# Patient Record
Sex: Female | Born: 1945 | Race: White | Hispanic: No | Marital: Married | Smoking: Never smoker
Health system: Southern US, Community
[De-identification: ages and names within clinical notes are randomized; demographics above are authoritative.]

## PROBLEM LIST (undated history)

## (undated) DIAGNOSIS — E785 Hyperlipidemia, unspecified: Secondary | ICD-10-CM

## (undated) DIAGNOSIS — I10 Essential (primary) hypertension: Secondary | ICD-10-CM

## (undated) DIAGNOSIS — K219 Gastro-esophageal reflux disease without esophagitis: Secondary | ICD-10-CM

## (undated) DIAGNOSIS — B019 Varicella without complication: Secondary | ICD-10-CM

## (undated) DIAGNOSIS — M8580 Other specified disorders of bone density and structure, unspecified site: Secondary | ICD-10-CM

## (undated) DIAGNOSIS — I499 Cardiac arrhythmia, unspecified: Secondary | ICD-10-CM

## (undated) HISTORY — PX: CHOLESTEATOMA EXCISION: SHX1345

---

## 2005-08-22 ENCOUNTER — Ambulatory Visit: Payer: Self-pay | Admitting: Family Medicine

## 2006-08-31 ENCOUNTER — Ambulatory Visit: Payer: Self-pay | Admitting: Unknown Physician Specialty

## 2006-09-25 ENCOUNTER — Ambulatory Visit: Payer: Self-pay | Admitting: Family Medicine

## 2006-10-06 ENCOUNTER — Ambulatory Visit: Payer: Self-pay | Admitting: Family Medicine

## 2006-12-30 ENCOUNTER — Ambulatory Visit: Payer: Self-pay | Admitting: Gastroenterology

## 2007-11-18 ENCOUNTER — Ambulatory Visit: Payer: Self-pay | Admitting: Family Medicine

## 2008-12-18 ENCOUNTER — Ambulatory Visit: Payer: Self-pay | Admitting: Family Medicine

## 2009-01-02 ENCOUNTER — Ambulatory Visit: Payer: Self-pay | Admitting: Family Medicine

## 2009-07-05 ENCOUNTER — Ambulatory Visit: Payer: Self-pay | Admitting: Family Medicine

## 2010-05-30 ENCOUNTER — Ambulatory Visit: Payer: Self-pay | Admitting: Family Medicine

## 2011-09-09 ENCOUNTER — Ambulatory Visit: Payer: Self-pay | Admitting: Family Medicine

## 2012-09-09 ENCOUNTER — Ambulatory Visit: Payer: Self-pay | Admitting: Family Medicine

## 2012-09-30 ENCOUNTER — Inpatient Hospital Stay: Payer: Self-pay | Admitting: Family Medicine

## 2012-09-30 LAB — CBC
HCT: 36.6 % (ref 35.0–47.0)
HGB: 12.3 g/dL (ref 12.0–16.0)
MCH: 31.5 pg (ref 26.0–34.0)
Platelet: 248 10*3/uL (ref 150–440)
RBC: 3.91 10*6/uL (ref 3.80–5.20)
WBC: 11.2 10*3/uL — ABNORMAL HIGH (ref 3.6–11.0)

## 2012-09-30 LAB — COMPREHENSIVE METABOLIC PANEL
Albumin: 4.3 g/dL (ref 3.4–5.0)
Anion Gap: 6 — ABNORMAL LOW (ref 7–16)
BUN: 13 mg/dL (ref 7–18)
Bilirubin,Total: 0.4 mg/dL (ref 0.2–1.0)
EGFR (African American): >60
EGFR (Non-African Amer.): 58 — ABNORMAL LOW
Glucose: 132 mg/dL — ABNORMAL HIGH (ref 65–99)
Potassium: 4.1 mmol/L (ref 3.5–5.1)
SGOT(AST): 27 U/L (ref 15–37)
Sodium: 141 mmol/L (ref 136–145)
Total Protein: 7.7 g/dL (ref 6.4–8.2)

## 2012-09-30 LAB — CK TOTAL AND CKMB (NOT AT ARMC)
CK, Total: 95 U/L (ref 21–215)
CK-MB: <0.5 ng/mL — ABNORMAL LOW (ref 0.5–3.6)

## 2012-10-01 LAB — TROPONIN I
Troponin-I: <0.02 ng/mL
Troponin-I: <0.02 ng/mL

## 2012-10-01 LAB — LIPID PANEL
Cholesterol: 138 mg/dL (ref 0–200)
Ldl Cholesterol, Calc: 72 mg/dL (ref 0–100)
Triglycerides: 96 mg/dL (ref 0–200)
VLDL Cholesterol, Calc: 19 mg/dL (ref 5–40)

## 2012-10-01 LAB — MAGNESIUM: Magnesium: 2 mg/dL

## 2012-10-01 LAB — CREATININE, SERUM
Creatinine: 0.93 mg/dL (ref 0.60–1.30)
EGFR (Non-African Amer.): >60

## 2012-10-01 LAB — POTASSIUM: Potassium: 4 mmol/L (ref 3.5–5.1)

## 2012-10-01 LAB — PROTIME-INR
INR: 0.9
Prothrombin Time: 12.8 secs (ref 11.5–14.7)

## 2012-10-02 LAB — COMPREHENSIVE METABOLIC PANEL
Alkaline Phosphatase: 71 U/L (ref 50–136)
Bilirubin,Total: 0.4 mg/dL (ref 0.2–1.0)
Calcium, Total: 8.8 mg/dL (ref 8.5–10.1)
Chloride: 109 mmol/L — ABNORMAL HIGH (ref 98–107)
Co2: 27 mmol/L (ref 21–32)
Creatinine: 1.07 mg/dL (ref 0.60–1.30)
EGFR (Non-African Amer.): 54 — ABNORMAL LOW
Potassium: 4.3 mmol/L (ref 3.5–5.1)
SGOT(AST): 18 U/L (ref 15–37)
SGPT (ALT): 22 U/L (ref 12–78)
Sodium: 138 mmol/L (ref 136–145)

## 2012-10-02 LAB — PROTIME-INR: Prothrombin Time: 13.7 secs (ref 11.5–14.7)

## 2012-10-02 LAB — CREATININE, SERUM: Creatinine: 1.01 mg/dL

## 2012-10-02 LAB — POTASSIUM: Potassium: 4.1 mmol/L

## 2012-10-03 LAB — BASIC METABOLIC PANEL
Anion Gap: 8 (ref 7–16)
BUN: 15 mg/dL (ref 7–18)
Calcium, Total: 8.8 mg/dL (ref 8.5–10.1)
Creatinine: 1.04 mg/dL (ref 0.60–1.30)
EGFR (Non-African Amer.): 56 — ABNORMAL LOW
Osmolality: 282 (ref 275–301)
Potassium: 4 mmol/L (ref 3.5–5.1)

## 2012-10-03 LAB — PROTIME-INR
INR: 1.2
Prothrombin Time: 16 secs — ABNORMAL HIGH (ref 11.5–14.7)

## 2013-09-12 ENCOUNTER — Ambulatory Visit: Payer: Self-pay | Admitting: Family Medicine

## 2013-09-27 ENCOUNTER — Ambulatory Visit: Payer: Self-pay | Admitting: Gastroenterology

## 2013-09-27 HISTORY — PX: COLONOSCOPY: SHX174

## 2013-09-28 LAB — PATHOLOGY REPORT

## 2014-09-25 ENCOUNTER — Ambulatory Visit: Payer: Self-pay | Admitting: Family Medicine

## 2015-02-13 NOTE — H&P (Signed)
PATIENT NAME:  PANDA, CROSSIN MR#:  [id number] DATE OF BIRTH:  [birth year]  DATE OF ADMISSION:  09/30/2012  PRIMARY CARE PHYSICIAN: Dr. Kary Kos   REFERRING PHYSICIAN: Dr. Darl Householder   CHIEF COMPLAINT: Chest pain today.   HISTORY OF PRESENT ILLNESS: The patient is a 69 year old Caucasian female with a history of hyperlipidemia who presented to the ED with chest pain today about 4 p.m. The chest pain is actually tight, constant, moderate, no radiation. The patient also has mild shortness of breath but denies any fever, chills, headache, dizziness, orthopnea, nocturnal dyspnea, or leg edema. The patient has no nausea, vomiting, or diarrhea. She was noted to have Afib with heart rate of 150, treated with diltiazem and heart rate decreased to about 99. The patient's troponin level is normal, less than 0.02.   PAST MEDICAL HISTORY: Hyperlipidemia.   SOCIAL HISTORY: No smoking, alcohol drinking, or illicit drugs. Living with her husband.   FAMILY HISTORY: Mother had hypertension.   PAST SURGICAL HISTORY: Ear surgery.  ALLERGIES: Allergic to codeine.   HOME MEDICATIONS: Atorvastatin 10 mg p.o. daily.   REVIEW OF SYSTEMS: CONSTITUTIONAL: The patient denies any fever or chills. No headache, no dizziness, no weakness, weight loss, weight gain. EYES: No double vision or blurred vision. ENT: No postnasal drip, slurred speech, or dysphagia. No epistaxis. CARDIOVASCULAR: Chest tightness. No palpitations, orthopnea, or nocturnal dyspnea. No leg edema. PULMONARY: No cough, sputum, shortness of breath, or hemoptysis. GI: No abdominal pain, nausea, vomiting, or diarrhea. No melena or bloody stool. GU: No dysuria, hematuria, or incontinence. ENDOCRINE: No polyuria, polydipsia, heat or cold intolerance. HEMATOLOGY: No easy bruising or bleeding. NEUROLOGY: No syncope, loss of consciousness, or seizure. PSYCH: No depression or anxiety. SKIN: No rash or jaundice.   PHYSICAL EXAMINATION:   VITAL SIGNS: Temperature 98.1,  blood pressure 170/96, pulse 140, respirations 20, oxygen saturation 95% on room air.   GENERAL: The patient is alert, awake, oriented in no acute distress.   HEENT: Pupils are round, equal, reactive to light and accommodation. Moist oral mucosa. Clear oropharynx.   NECK: Supple. No JVD or carotid bruits. No lymphadenopathy. No thyromegaly.   CARDIOVASCULAR: S1, S2 regular rate and rhythm. No murmurs or gallops.   PULMONARY: Bilateral air entry. No wheezing or rales. No use of accessory muscles to breathe.   ABDOMEN: Soft. No distention. No tenderness. No organomegaly. Bowel sounds present.   EXTREMITIES: No edema, clubbing, or cyanosis. No calf tenderness. Strong bilateral pedal pulses.   NEUROLOGY: Alert and oriented x3. No focal deficits. Power 5 out of 5. Sensation intact.   LABORATORY, DIAGNOSTIC, AND RADIOLOGICAL DATA: Chest x-ray no evidence for pneumonia or CHF.   CK 95. CK-MB less than 0.5. Glucose 132, BUN 13, creatinine 1.02. Electrolytes are normal. WBC 11.2, hemoglobin 12.3, platelets 248. Troponin less than 0.02.   EKG showed atrial fibrillation at 141 beats per minute with right bundle branch block. Second EKG showed atrial fibrillation at 99 beats per minute with right bundle branch block.   IMPRESSION:  1. Chest pain, rule out ACS.  2. New onset atrial fibrillation.  3. Right bundle branch block. 4. Hyperlipidemia.   PLAN OF TREATMENT:  1. The patient will be placed for observation.  2. Will continue telemonitor. 3. Will give aspirin 81 mg p.o. daily.  4. Give Lovenox 1 mg/kg q.12 hours and start Lopressor 25 mg p.o. q.12 hours. 5. In addition, we will get an echocardiogram and Cardiology consult. The patient has new onset atrial fibrillation but  Mali score is low risk. Possibly she does not need anticoagulation but we will follow-up with cardiologist.  6. For chest pain we will check troponin level, lipid panel, and TSH. In addition, we will get a stress test.   7. GI and DVT prophylaxis.   Discussed the patient's situation with the patient and her husband.   TIME SPENT: About 55 minutes.   ____________________________ Demetrios Loll, MD qc:drc D: 09/30/2012 07:37:10 ET T: 10/01/2012 06:39:34 ET JOB#: [id number]  cc: Demetrios Loll, MD, <Dictator> Irven Easterly. Kary Kos, MD Demetrios Loll MD ELECTRONICALLY SIGNED 10/02/2012 12:57

## 2015-02-13 NOTE — Discharge Summary (Signed)
PATIENT NAME:  Betty Nichols, Betty Nichols MR#:  [id number] DATE OF BIRTH:  1946/04/10  DATE OF ADMISSION:  09/30/2012 DATE OF DISCHARGE:  10/04/2012  ADMISSION DIAGNOSIS: Chest pain.   DISCHARGE DIAGNOSES:  1. Atrial fibrillation with rapid ventricular response.  2. Chest pain, likely due to rapid ventricular response.  3. Hyperlipidemia.  4. Gastroesophageal reflux disease.  5. Hyperglycemia with normal hemoglobin A1c, likely due to stress.   DISPOSITION: Home.   FOLLOW UP: Follow up Dr. Neoma Laming tomorrow for EKG and INR check.   MEDICATIONS AT DISCHARGE; 1. Continue iron therapy.  2. Continue calcium therapy.  3. Continue Tikosyn 500 mcg or capsule twice daily.  4. Coumadin 7.5 mg once a day. 5. Atorvastatin 10 mg once a day.   LABORATORY, DIAGNOSTIC AND RADIOLOGICAL DATA: Glucose ranging in between 100 to 148, creatinine 1.04, sodium 141, potassium 4. LFTs within normal limits. Cardiac enzymes negative x3. TSH 0.7, hemoglobin 12.3, white count 11.2, platelets 248. INR 1.2 at discharge. EKG atrial fibrillation with rapid ventricular response, right now in normal sinus rhythm. CT of the chest with IV contrast to rule out pulmonary embolism was negative. No other findings. Chest x-ray without evidence of pneumonia or congestive heart failure. Echo Doppler normal chamber size, normal left ventricular ejection fraction, mild MR, trace TR, PR, normal pulmonary pressures, ejection fraction 55%.   HOSPITAL COURSE: Ms. Betty Nichols is a very nice 69 year old female who has history of hyperlipidemia mostly who came to the ER on 09/30/2012 around 4:00 p.m. with chest pain that started during the hours of the afternoon. The pain was constant, felt like it tightness on her chest, moderate intensity with no radiation. At that moment she was starting to feel a little bit short of breath but she did have any fevers, chills, headaches, or any other problems. The patient did not have any nausea, vomiting, or  diaphoresis. She was found to be in atrial fibrillation with heart rate of 150s for what diltiazem was given. Patient was fully anticoagulated with Lovenox and Lopressor was started as well. The patient had a new onset atrial fibrillation and his CHADS score is 1. Dr. Neoma Laming consulted for the patient. He decided to start Tikosyn and the patient converted to normal sinus rhythm on 10/03/2012. Her heart rate remained elevated in the 130s to 160s prior to the 8th for what her medications were adjusted. At this moment she is asymptomatic and her heart rate is in the 60s to 80s. Metoprolol has been stopped due to the patient's low blood pressure. She does not a current diagnosis of hypertension. She actually does not have any history of coronary artery disease although she will follow up with Dr. Humphrey Rolls for future stress tests once her heart rate is stable for a while. Dr. Humphrey Rolls wants to keep her on Coumadin for at least a month despite the fact that she had a low CHADS score only as a precaution. She is going to be following her INR with him tomorrow and also EKG due to the possible prolonged QT syndrome that could happen with Tikosyn. Patient is discharged in good condition.   TIME SPENT: I spent about 40 minutes with this discharge.  ____________________________ [place] Sink, MD rsg:cms D: 10/04/2012 10:00:02 ET T: 10/04/2012 10:12:06 ET JOB#: [id number]  cc: [place] Sink, MD, <Dictator> Jonesha Tsuchiya America Brown MD ELECTRONICALLY SIGNED 10/10/2012 14:07

## 2015-02-16 NOTE — Consult Note (Signed)
PATIENT NAME:  Betty Nichols, Betty Nichols MR#:  [id number] DATE OF BIRTH:  09/24/1946  DATE OF CONSULTATION:  10/01/2012  REFERRING PHYSICIAN:   CONSULTING PHYSICIAN:  Dionisio David, MD  INDICATION FOR CONSULTATION: Atrial fibrillation.   HISTORY OF PRESENT ILLNESS: This is a 69 year old pleasant white female with a past medical history of hypertension and hyperlipidemia who came into the hospital because of chest pain. The chest pain was burning-type, associated with some shortness of breath. No palpitation. She was found to be in atrial fibrillation, rate 160 when she exerts.   PAST MEDICAL HISTORY: History of hypercholesterolemia. She takes atorvastatin 10 mg once a day.   SOCIAL HISTORY: Unremarkable.   FAMILY HISTORY: Unremarkable.   ALLERGIES: None.   PHYSICAL EXAMINATION:  GENERAL: She is alert, oriented times three, in no acute distress. Pulse right now is 104. This morning it went up as high as 160 on the monitor. Blood pressure 118/73, respirations 18, temperature 98.1, and saturation 96.   NECK: No JVD.   LUNGS: Clear.   HEART: Irregularly irregular pulse. Normal S1, S2. No audible murmur.   ABDOMEN: Soft, nontender, positive bowel sounds.   EXTREMITIES: No pedal edema.   NEUROLOGIC: She appears to be intact.   LABS/STUDIES: EKG shows atrial fibrillation at 138 beats per minute, nonspecific ST-T changes, right bundle branch block.   ASSESSMENT AND PLAN: The patient has atrial fibrillation with rapid ventricular response rate. She is already on metoprolol.   PLAN: Start the patient on Tikosyn and we will try to convert the patient to sinus rhythm. Also we will get an echocardiogram and cancel the stress test. Thank you very much for the referral.    ____________________________ Dionisio David, MD sak:bjt D: 10/01/2012 08:54:29 ET T: 10/01/2012 09:32:35 ET JOB#: [id number]  cc: Dionisio David, MD, <Dictator> Dionisio David MD ELECTRONICALLY SIGNED 11/01/2012 8:01

## 2015-04-04 ENCOUNTER — Other Ambulatory Visit: Payer: Self-pay | Admitting: Internal Medicine

## 2015-04-04 DIAGNOSIS — R05 Cough: Secondary | ICD-10-CM

## 2015-04-04 DIAGNOSIS — R059 Cough, unspecified: Secondary | ICD-10-CM

## 2015-04-04 DIAGNOSIS — K219 Gastro-esophageal reflux disease without esophagitis: Secondary | ICD-10-CM

## 2015-04-11 ENCOUNTER — Ambulatory Visit
Admission: RE | Admit: 2015-04-11 | Discharge: 2015-04-11 | Disposition: A | Discharge status: Home / Self Care | Payer: Medicare Other | Source: Ambulatory Visit | Attending: Internal Medicine | Admitting: Internal Medicine

## 2015-04-11 DIAGNOSIS — R059 Cough, unspecified: Secondary | ICD-10-CM

## 2015-04-11 DIAGNOSIS — K219 Gastro-esophageal reflux disease without esophagitis: Secondary | ICD-10-CM | POA: Diagnosis present

## 2015-04-11 DIAGNOSIS — R05 Cough: Secondary | ICD-10-CM

## 2015-04-11 DIAGNOSIS — K449 Diaphragmatic hernia without obstruction or gangrene: Secondary | ICD-10-CM | POA: Insufficient documentation

## 2015-08-28 ENCOUNTER — Other Ambulatory Visit: Payer: Self-pay | Admitting: Family Medicine

## 2015-08-28 DIAGNOSIS — Z1231 Encounter for screening mammogram for malignant neoplasm of breast: Secondary | ICD-10-CM

## 2015-09-27 ENCOUNTER — Ambulatory Visit
Admission: RE | Admit: 2015-09-27 | Discharge: 2015-09-27 | Disposition: A | Discharge status: Home / Self Care | Payer: Medicare Other | Source: Ambulatory Visit | Attending: Family Medicine | Admitting: Family Medicine

## 2015-09-27 DIAGNOSIS — Z1231 Encounter for screening mammogram for malignant neoplasm of breast: Secondary | ICD-10-CM | POA: Diagnosis present

## 2016-09-08 ENCOUNTER — Other Ambulatory Visit: Payer: Self-pay | Admitting: Family Medicine

## 2016-09-08 DIAGNOSIS — Z1231 Encounter for screening mammogram for malignant neoplasm of breast: Secondary | ICD-10-CM

## 2016-10-14 ENCOUNTER — Ambulatory Visit
Admission: RE | Admit: 2016-10-14 | Discharge: 2016-10-14 | Disposition: A | Discharge status: Home / Self Care | Payer: Medicare Other | Source: Ambulatory Visit | Attending: Family Medicine | Admitting: Family Medicine

## 2016-10-14 DIAGNOSIS — Z1231 Encounter for screening mammogram for malignant neoplasm of breast: Secondary | ICD-10-CM | POA: Diagnosis present

## 2017-09-04 ENCOUNTER — Other Ambulatory Visit: Payer: Self-pay | Admitting: Family Medicine

## 2017-09-04 DIAGNOSIS — Z1231 Encounter for screening mammogram for malignant neoplasm of breast: Secondary | ICD-10-CM

## 2017-10-16 ENCOUNTER — Ambulatory Visit
Admission: RE | Admit: 2017-10-16 | Discharge: 2017-10-16 | Disposition: A | Discharge status: Home / Self Care | Payer: Medicare Other | Source: Ambulatory Visit | Attending: Family Medicine | Admitting: Family Medicine

## 2017-10-16 DIAGNOSIS — Z1231 Encounter for screening mammogram for malignant neoplasm of breast: Secondary | ICD-10-CM | POA: Insufficient documentation

## 2018-09-08 ENCOUNTER — Other Ambulatory Visit: Payer: Self-pay | Admitting: Family Medicine

## 2018-09-08 DIAGNOSIS — Z1231 Encounter for screening mammogram for malignant neoplasm of breast: Secondary | ICD-10-CM

## 2018-10-18 ENCOUNTER — Ambulatory Visit
Admission: RE | Admit: 2018-10-18 | Discharge: 2018-10-18 | Disposition: A | Discharge status: Home / Self Care | Payer: Medicare Other | Source: Ambulatory Visit | Attending: Family Medicine | Admitting: Family Medicine

## 2018-10-18 DIAGNOSIS — Z1231 Encounter for screening mammogram for malignant neoplasm of breast: Secondary | ICD-10-CM | POA: Diagnosis present

## 2018-11-15 ENCOUNTER — Encounter: Payer: Self-pay | Admitting: *Deleted

## 2018-11-16 ENCOUNTER — Encounter: Payer: Self-pay | Admitting: Student

## 2018-11-16 ENCOUNTER — Ambulatory Visit: Payer: Medicare Other | Admitting: Certified Registered"

## 2018-11-16 ENCOUNTER — Encounter
Admission: RE | Disposition: A | Discharge status: Home / Self Care | Payer: Self-pay | Source: Home / Self Care | Attending: Gastroenterology

## 2018-11-16 ENCOUNTER — Ambulatory Visit
Admission: RE | Admit: 2018-11-16 | Discharge: 2018-11-16 | Disposition: A | Discharge status: Home / Self Care | Payer: Medicare Other | Attending: Gastroenterology | Admitting: Gastroenterology

## 2018-11-16 ENCOUNTER — Other Ambulatory Visit: Payer: Self-pay

## 2018-11-16 DIAGNOSIS — K219 Gastro-esophageal reflux disease without esophagitis: Secondary | ICD-10-CM | POA: Diagnosis not present

## 2018-11-16 DIAGNOSIS — Z86010 Personal history of colonic polyps: Secondary | ICD-10-CM | POA: Insufficient documentation

## 2018-11-16 DIAGNOSIS — Z1211 Encounter for screening for malignant neoplasm of colon: Secondary | ICD-10-CM | POA: Insufficient documentation

## 2018-11-16 DIAGNOSIS — I4891 Unspecified atrial fibrillation: Secondary | ICD-10-CM | POA: Diagnosis not present

## 2018-11-16 DIAGNOSIS — I10 Essential (primary) hypertension: Secondary | ICD-10-CM | POA: Diagnosis not present

## 2018-11-16 DIAGNOSIS — K621 Rectal polyp: Secondary | ICD-10-CM | POA: Insufficient documentation

## 2018-11-16 DIAGNOSIS — Z7901 Long term (current) use of anticoagulants: Secondary | ICD-10-CM | POA: Insufficient documentation

## 2018-11-16 DIAGNOSIS — K640 First degree hemorrhoids: Secondary | ICD-10-CM | POA: Insufficient documentation

## 2018-11-16 DIAGNOSIS — E785 Hyperlipidemia, unspecified: Secondary | ICD-10-CM | POA: Insufficient documentation

## 2018-11-16 DIAGNOSIS — Z79899 Other long term (current) drug therapy: Secondary | ICD-10-CM | POA: Insufficient documentation

## 2018-11-16 DIAGNOSIS — D122 Benign neoplasm of ascending colon: Secondary | ICD-10-CM | POA: Diagnosis not present

## 2018-11-16 DIAGNOSIS — Z7951 Long term (current) use of inhaled steroids: Secondary | ICD-10-CM | POA: Insufficient documentation

## 2018-11-16 HISTORY — DX: Other specified disorders of bone density and structure, unspecified site: M85.80

## 2018-11-16 HISTORY — PX: COLONOSCOPY WITH PROPOFOL: SHX5780

## 2018-11-16 HISTORY — DX: Varicella without complication: B01.9

## 2018-11-16 HISTORY — DX: Gastro-esophageal reflux disease without esophagitis: K21.9

## 2018-11-16 HISTORY — DX: Cardiac arrhythmia, unspecified: I49.9

## 2018-11-16 HISTORY — DX: Hyperlipidemia, unspecified: E78.5

## 2018-11-16 HISTORY — DX: Essential (primary) hypertension: I10

## 2018-11-16 SURGERY — COLONOSCOPY WITH PROPOFOL
Anesthesia: General

## 2018-11-16 MED ORDER — SODIUM CHLORIDE 0.9 % IV SOLN
INTRAVENOUS | Status: DC
  Administered 2018-11-16: 08:00:00 via INTRAVENOUS

## 2018-11-16 MED ORDER — PROPOFOL 10 MG/ML IV BOLUS
INTRAVENOUS | Status: AC
  Filled 2018-11-16: qty 40

## 2018-11-16 MED ORDER — PROPOFOL 10 MG/ML IV BOLUS
INTRAVENOUS | Status: DC | PRN
  Administered 2018-11-16: 80 mg via INTRAVENOUS

## 2018-11-16 MED ORDER — PROPOFOL 500 MG/50ML IV EMUL
INTRAVENOUS | Status: DC | PRN
Start: 2018-11-16 — End: 2018-11-16
  Administered 2018-11-16: 100 ug/kg/min via INTRAVENOUS

## 2018-11-16 MED ORDER — LIDOCAINE HCL (CARDIAC) PF 100 MG/5ML IV SOSY
PREFILLED_SYRINGE | INTRAVENOUS | Status: DC | PRN
  Administered 2018-11-16: 80 mg via INTRAVENOUS

## 2018-11-16 NOTE — Anesthesia Postprocedure Evaluation (Signed)
Anesthesia Post Note  Patient: Betty Nichols  Procedure(s) Performed: COLONOSCOPY WITH PROPOFOL (N/A )  Patient location during evaluation: Endoscopy Anesthesia Type: General Level of consciousness: awake and alert Pain management: pain level controlled Vital Signs Assessment: post-procedure vital signs reviewed and stable Respiratory status: spontaneous breathing, nonlabored ventilation, respiratory function stable and patient connected to nasal cannula oxygen Cardiovascular status: blood pressure returned to baseline and stable Postop Assessment: no apparent nausea or vomiting Anesthetic complications: no     Last Vitals:  Vitals:   11/16/18 0830 11/16/18 0840  BP: 131/71 (!) 141/63  Pulse: 63 (!) 59  Resp: 17 17  Temp:    SpO2: 99% 99%    Last Pain:  Vitals:   11/16/18 0820  TempSrc: Oral  PainSc:                  Betty Nichols

## 2018-11-16 NOTE — Transfer of Care (Signed)
Immediate Anesthesia Transfer of Care Note  Patient: Betty Nichols  Procedure(s) Performed: COLONOSCOPY WITH PROPOFOL (N/A )  Patient Location: PACU  Anesthesia Type:General  Level of Consciousness: awake, alert  and oriented  Airway & Oxygen Therapy: Patient Spontanous Breathing  Post-op Assessment: Report given to RN and Post -op Vital signs reviewed and stable  Post vital signs: Reviewed and stable  Last Vitals:  Vitals Value Taken Time  BP    Temp    Pulse    Resp    SpO2      Last Pain:  Vitals:   11/16/18 0722  TempSrc: Tympanic  PainSc: 0-No pain         Complications: No apparent anesthesia complications

## 2018-11-16 NOTE — Anesthesia Post-op Follow-up Note (Signed)
Anesthesia QCDR form completed.        

## 2018-11-16 NOTE — Anesthesia Preprocedure Evaluation (Signed)
Anesthesia Evaluation  Patient identified by MRN, date of birth, ID band Patient awake    Reviewed: Allergy & Precautions, H&P , NPO status , Patient's Chart, lab work & pertinent test results  History of Anesthesia Complications Negative for: history of anesthetic complications  Airway Mallampati: III  TM Distance: <3 FB Neck ROM: limited    Dental  (+) Chipped   Pulmonary neg pulmonary ROS, neg shortness of breath,           Cardiovascular Exercise Tolerance: Good hypertension, (-) angina(-) Past MI and (-) DOE + dysrhythmias Atrial Fibrillation      Neuro/Psych negative neurological ROS  negative psych ROS   GI/Hepatic Neg liver ROS, GERD  Medicated and Controlled,  Endo/Other  negative endocrine ROS  Renal/GU negative Renal ROS  negative genitourinary   Musculoskeletal   Abdominal   Peds  Hematology negative hematology ROS (+)   Anesthesia Other Findings Past Medical History: No date: Chickenpox No date: Dysrhythmia     Comment:  atrial fib No date: GERD (gastroesophageal reflux disease) No date: Hyperlipidemia No date: Hypertension No date: Osteopenia  Past Surgical History: No date: CHOLESTEATOMA EXCISION 09/27/2013: COLONOSCOPY  BMI    Body Mass Index:  28.34 kg/m      Reproductive/Obstetrics negative OB ROS                             Anesthesia Physical Anesthesia Plan  ASA: III  Anesthesia Plan: General   Post-op Pain Management:    Induction: Intravenous  PONV Risk Score and Plan: Propofol infusion and TIVA  Airway Management Planned: Natural Airway and Nasal Cannula  Additional Equipment:   Intra-op Plan:   Post-operative Plan:   Informed Consent: I have reviewed the patients History and Physical, chart, labs and discussed the procedure including the risks, benefits and alternatives for the proposed anesthesia with the patient or authorized  representative who has indicated his/her understanding and acceptance.     Dental Advisory Given  Plan Discussed with: Anesthesiologist, CRNA and Surgeon  Anesthesia Plan Comments: (Patient consented for risks of anesthesia including but not limited to:  - adverse reactions to medications - risk of intubation if required - damage to teeth, lips or other oral mucosa - sore throat or hoarseness - Damage to heart, brain, lungs or loss of life  Patient voiced understanding.)        Anesthesia Quick Evaluation

## 2018-11-16 NOTE — H&P (Signed)
Outpatient short stay form Pre-procedure 11/16/2018 7:26 AM  Betty Sails MD  Primary Physician: Dr. [place] Pink  Reason for visit: Colonoscopy  History of present illness: Patient is a 73 year old female presenting today for colonoscopy in regards to her personal history of adenomatous colon polyps.  Her last procedure was 09/27/2013.  Patient does take Xarelto which she is held for over 48 hours.  She takes no other blood thinning agent or aspirin product.  She tolerated her prep well.   No current facility-administered medications for this encounter.   Medications Prior to Admission  Medication Sig Dispense Refill Last Dose  . atorvastatin (LIPITOR) 10 MG tablet Take 10 mg by mouth daily.   11/16/2018 at 0530  . cholecalciferol (VITAMIN D3) 10 MCG (400 UNIT) TABS tablet Take 1,000 Units by mouth daily.   Past Week at Unknown time  . dofetilide (TIKOSYN) 250 MCG capsule Take 250 mcg by mouth 2 (two) times daily.   11/16/2018 at 0530  . Ferrous Sulfate 140 (45 Fe) MG TBCR Take 140 mg by mouth daily.   Past Week at Unknown time  . fluticasone (FLONASE) 50 MCG/ACT nasal spray Place 2 sprays into both nostrils daily.     Marland Kitchen glucosamine-chondroitin 500-400 MG tablet Take 1 tablet by mouth daily.   Past Week at Unknown time  . hydrALAZINE (APRESOLINE) 50 MG tablet Take 50 mg by mouth 3 (three) times daily.   11/16/2018 at 0530  . metoprolol succinate (TOPROL-XL) 50 MG 24 hr tablet Take 50 mg by mouth daily. Take with or immediately following a meal.   11/15/2018 at 2230  . Multiple Vitamin (MULTIVITAMIN) tablet Take 1 tablet by mouth daily.   Past Week at Unknown time  . omeprazole (PRILOSEC) 20 MG capsule Take 20 mg by mouth daily.   11/16/2018 at 0530  . rivaroxaban (XARELTO) 20 MG TABS tablet Take 20 mg by mouth daily with supper.   11/12/2018     Allergies  Allergen Reactions  . Codeine Nausea Only     Past Medical History:  Diagnosis Date  . Chickenpox   . Dysrhythmia    atrial fib  . GERD (gastroesophageal reflux disease)   . Hyperlipidemia   . Hypertension   . Osteopenia     Review of systems:      Physical Exam    Heart and lungs: Regular rate and rhythm without rub or gallop lungs are bilaterally clear    HEENT: Normocephalic atraumatic eyes are anicteric    Other:    Pertinant exam for procedure: Soft nontender nondistended bowel sounds positive normoactive    Planned proceedures: Colonoscopy and indicated procedures. I have discussed the risks benefits and complications of procedures to include not limited to bleeding, infection, perforation and the risk of sedation and the patient wishes to proceed.    Betty Sails, MD Gastroenterology 11/16/2018  7:26 AM

## 2018-11-16 NOTE — Op Note (Signed)
[facility] Gastroenterology Patient Name: Betty Nichols Procedure Date: 11/16/2018 7:34 AM MRN: [id number] Account #: [id number] Date of Birth: [birth year] Admit Type: Outpatient Age: 73 Room: [facility] ENDO ROOM 3 Gender: Female Note Status: Finalized Procedure:            Colonoscopy Indications:          Personal history of colonic polyps Providers:            Lollie Sails, MD Referring MD:         Irven Easterly. Kary Kos, MD (Referring MD) Medicines:            Monitored Anesthesia Care Complications:        No immediate complications. Procedure:            Pre-Anesthesia Assessment:                       - ASA Grade Assessment: II - A patient with mild                        systemic disease.                       After obtaining informed consent, the colonoscope was                        passed under direct vision. Throughout the procedure,                        the patient's blood pressure, pulse, and oxygen                        saturations were monitored continuously. The was                        introduced through the anus and advanced to the the                        cecum, identified by appendiceal orifice and ileocecal                        valve. The colonoscopy was performed without                        difficulty. The patient tolerated the procedure well.                        The quality of the bowel preparation was good. Findings:      A 5 mm polyp was found in the proximal ascending colon. The polyp was       sessile. The polyp was removed with a cold snare. Resection and       retrieval were complete.      Four sessile polyps were found in the rectum. The polyps were less than       1 mm in size. These polyps were removed with a cold biopsy forceps.       Resection and retrieval were complete.      Non-bleeding external and internal hemorrhoids were found during       anoscopy. The hemorrhoids were small and Grade I (internal hemorrhoids        that do not prolapse).  The digital rectal exam was normal.      The exam was otherwise without abnormality. Impression:           - One 5 mm polyp in the proximal ascending colon,                        removed with a cold snare. Resected and retrieved.                       - Four less than 1 mm polyps in the rectum, removed                        with a cold biopsy forceps. Resected and retrieved.                       - Non-bleeding external and internal hemorrhoids. Recommendation:       - Discharge patient to home.                       - Telephone GI clinic for pathology results in 1 week. Procedure Code(s):    --- Professional ---                       [phone], Colonoscopy, flexible; with removal of tumor(s),                        polyp(s), or other lesion(s) by snare technique                       [id number], 43, Colonoscopy, flexible; with biopsy, single                        or multiple Diagnosis Code(s):    --- Professional ---                       D12.2, Benign neoplasm of ascending colon                       K62.1, Rectal polyp                       K64.0, First degree hemorrhoids                       Z86.010, Personal history of colonic polyps CPT copyright 2018 [employer]. All rights reserved. The codes documented in this report are preliminary and upon coder review may  be revised to meet current compliance requirements. Lollie Sails, MD 11/16/2018 8:24:16 AM This report has been signed electronically. Number of Addenda: 0 Note Initiated On: 11/16/2018 7:34 AM Scope Withdrawal Time: 0 hours 17 minutes 9 seconds  Total Procedure Duration: 0 hours 26 minutes 17 seconds       [facility]

## 2018-11-17 ENCOUNTER — Encounter: Payer: Self-pay | Admitting: Gastroenterology

## 2018-11-17 LAB — SURGICAL PATHOLOGY

## 2019-06-27 DIAGNOSIS — C4491 Basal cell carcinoma of skin, unspecified: Secondary | ICD-10-CM | POA: Insufficient documentation

## 2019-07-25 DIAGNOSIS — E7800 Pure hypercholesterolemia, unspecified: Secondary | ICD-10-CM | POA: Insufficient documentation

## 2019-07-25 DIAGNOSIS — I4891 Unspecified atrial fibrillation: Secondary | ICD-10-CM | POA: Insufficient documentation

## 2019-09-13 ENCOUNTER — Other Ambulatory Visit: Payer: Self-pay | Admitting: Family Medicine

## 2019-09-13 DIAGNOSIS — Z1231 Encounter for screening mammogram for malignant neoplasm of breast: Secondary | ICD-10-CM

## 2019-10-24 ENCOUNTER — Ambulatory Visit
Admission: RE | Admit: 2019-10-24 | Discharge: 2019-10-24 | Disposition: A | Discharge status: Home / Self Care | Payer: Medicare Other | Source: Ambulatory Visit | Attending: Family Medicine | Admitting: Family Medicine

## 2019-10-24 DIAGNOSIS — Z1231 Encounter for screening mammogram for malignant neoplasm of breast: Secondary | ICD-10-CM | POA: Insufficient documentation

## 2020-02-13 DIAGNOSIS — Z85828 Personal history of other malignant neoplasm of skin: Secondary | ICD-10-CM | POA: Insufficient documentation

## 2020-09-25 ENCOUNTER — Other Ambulatory Visit: Payer: Self-pay | Admitting: Family Medicine

## 2020-09-25 DIAGNOSIS — Z1231 Encounter for screening mammogram for malignant neoplasm of breast: Secondary | ICD-10-CM

## 2020-10-24 ENCOUNTER — Ambulatory Visit
Admission: RE | Admit: 2020-10-24 | Discharge: 2020-10-24 | Disposition: A | Discharge status: Home / Self Care | Payer: Medicare PPO | Source: Ambulatory Visit | Attending: Family Medicine | Admitting: Family Medicine

## 2020-10-24 ENCOUNTER — Other Ambulatory Visit: Payer: Self-pay

## 2020-10-24 DIAGNOSIS — Z1231 Encounter for screening mammogram for malignant neoplasm of breast: Secondary | ICD-10-CM | POA: Insufficient documentation

## 2021-09-16 ENCOUNTER — Other Ambulatory Visit: Payer: Self-pay | Admitting: Family Medicine

## 2021-09-16 DIAGNOSIS — Z1231 Encounter for screening mammogram for malignant neoplasm of breast: Secondary | ICD-10-CM

## 2021-10-25 ENCOUNTER — Ambulatory Visit
Admission: RE | Admit: 2021-10-25 | Discharge: 2021-10-25 | Disposition: A | Discharge status: Home / Self Care | Payer: Medicare PPO | Source: Ambulatory Visit | Attending: Family Medicine | Admitting: Family Medicine

## 2021-10-25 ENCOUNTER — Other Ambulatory Visit: Payer: Self-pay

## 2021-10-25 DIAGNOSIS — Z1231 Encounter for screening mammogram for malignant neoplasm of breast: Secondary | ICD-10-CM | POA: Insufficient documentation

## 2022-04-05 IMAGING — MG MM DIGITAL SCREENING BILAT W/ TOMO AND CAD
6 of 12 series · 6 of 36 positions shown · non-contrast
Comparison: Previous exam(s).

CLINICAL DATA: Screening.

EXAM:
DIGITAL SCREENING BILATERAL MAMMOGRAM WITH TOMOSYNTHESIS AND CAD
TECHNIQUE: Bilateral screening digital craniocaudal and mediolateral oblique
mammograms were obtained. Bilateral screening digital breast
tomosynthesis was performed. The images were evaluated with
computer-aided detection.

[L CC synth-2D (1 of 2)]
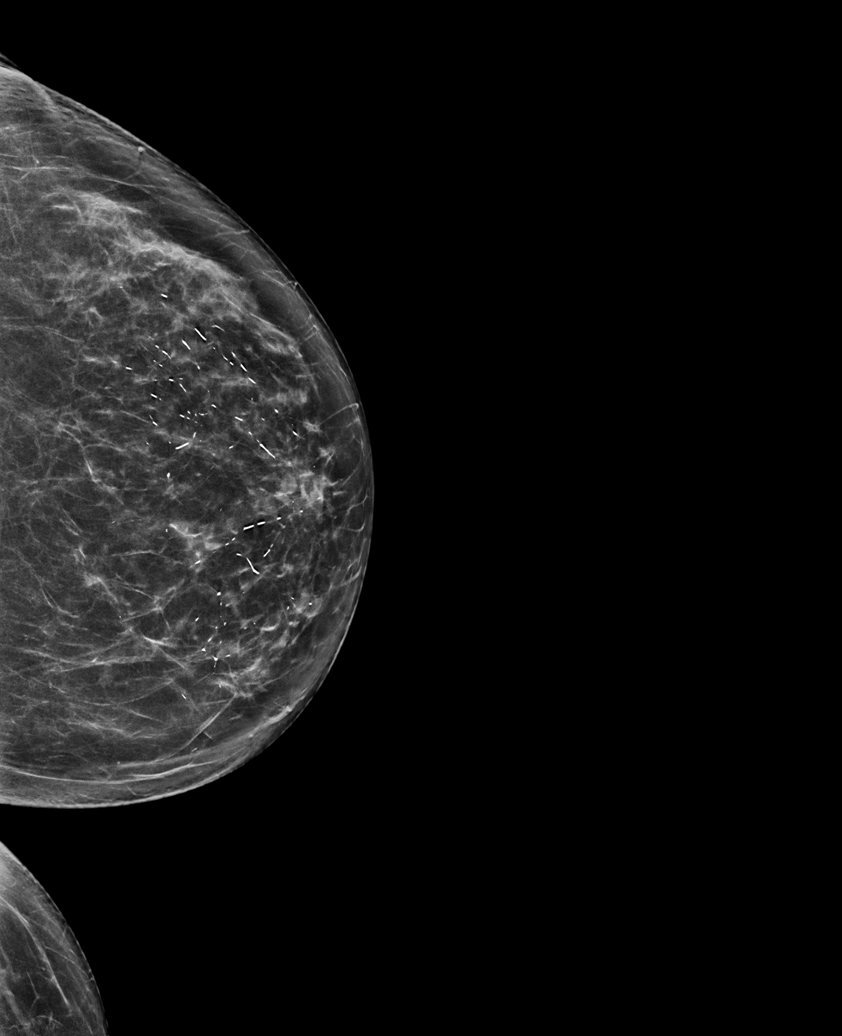

[R CC synth-2D]
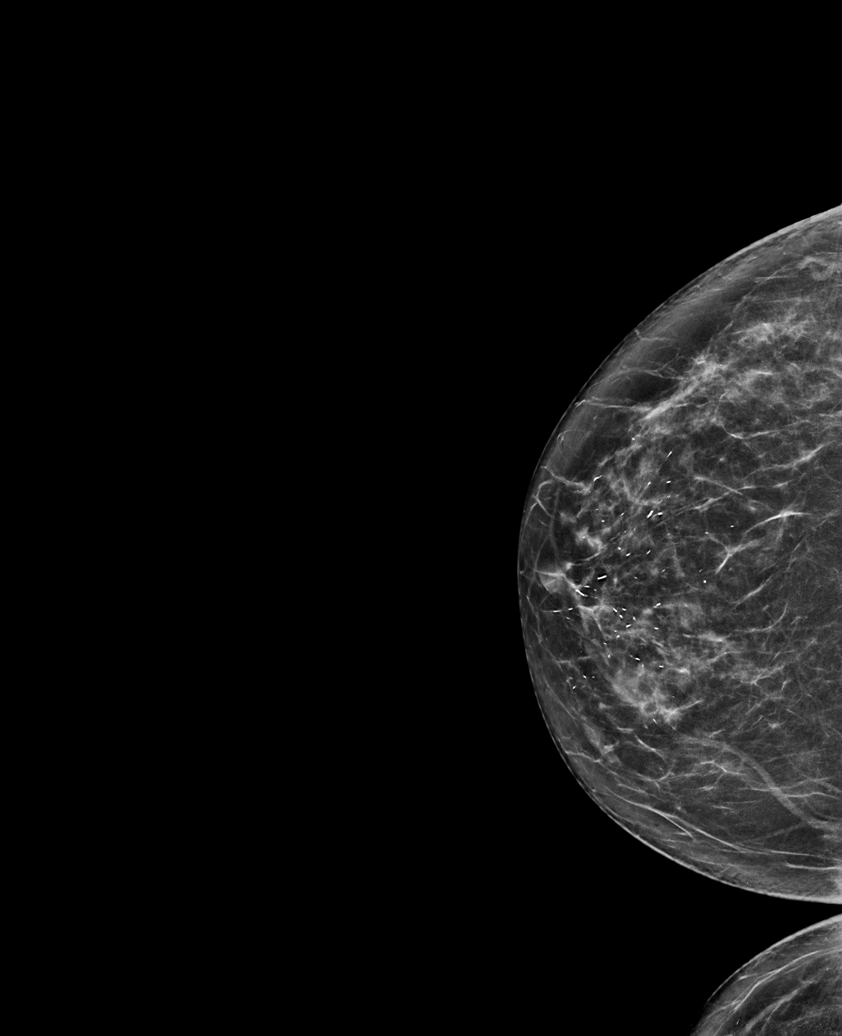

[L MLO synth-2D]
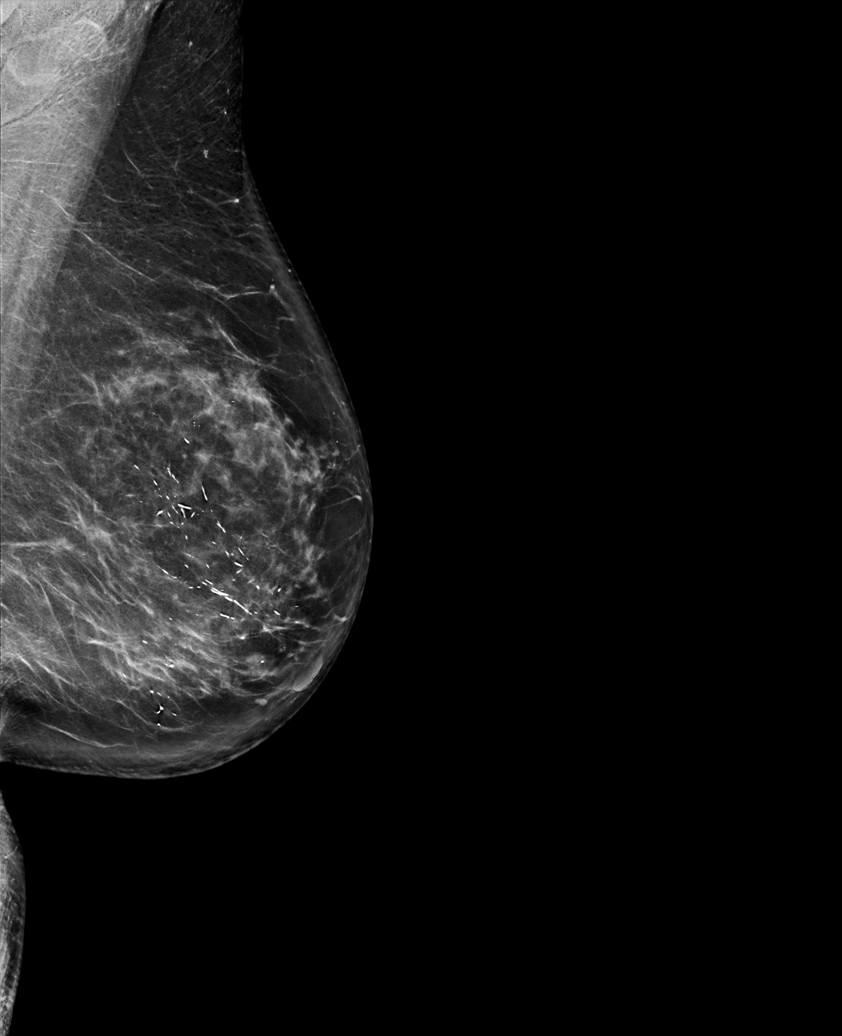

[R MLO synth-2D (1 of 2)]
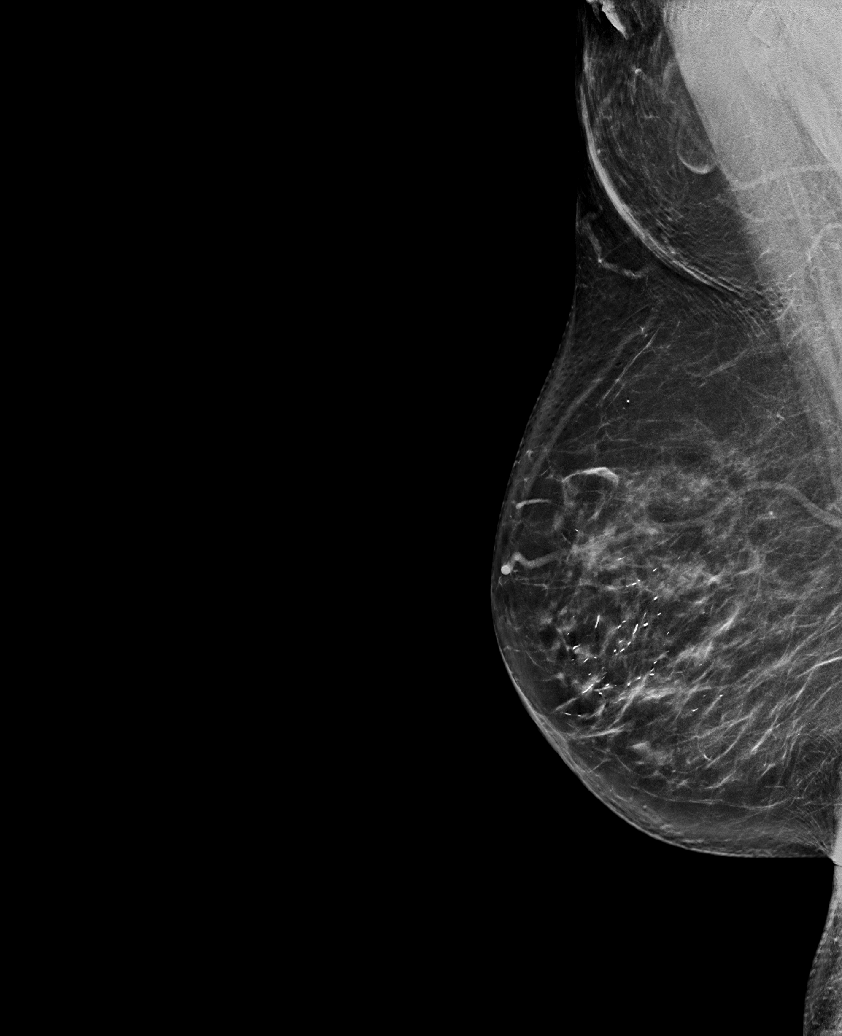

[L CC synth-2D (2 of 2)]
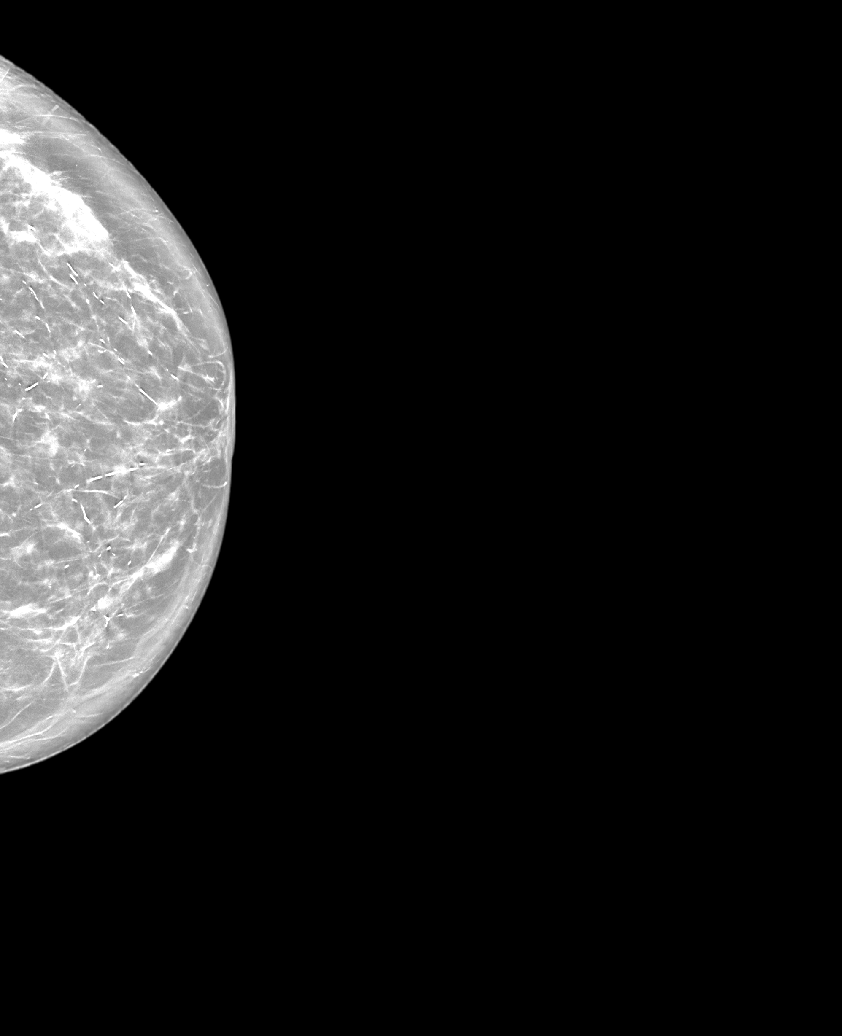

[R MLO synth-2D (2 of 2)]
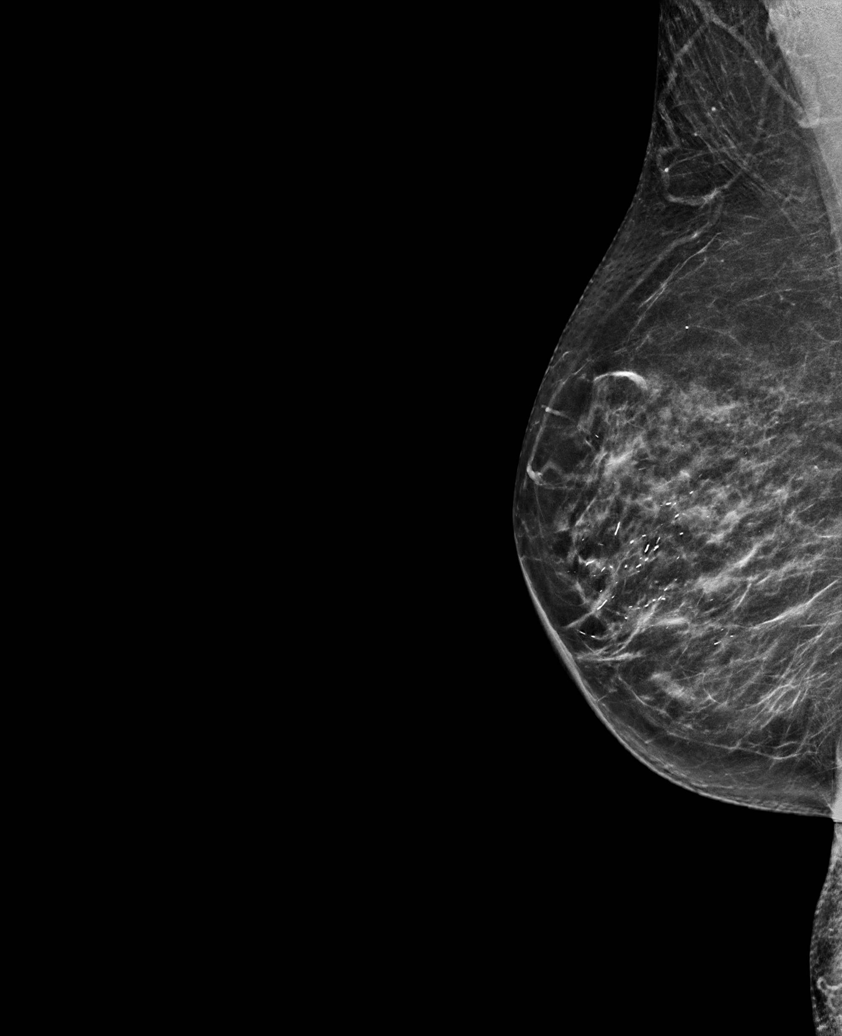

[6 of 36 positions shown; findings below may reference images not displayed]

ACR Breast Density Category c: The breast tissue is heterogeneously
dense, which may obscure small masses.
FINDINGS: There are no findings suspicious for malignancy.
IMPRESSION: No mammographic evidence of malignancy. A result letter of this
screening mammogram will be mailed directly to the patient.

RECOMMENDATION:
Screening mammogram in one year. (Code:Q3-W-BC3)

BI-RADS CATEGORY  1: Negative.

## 2022-09-23 ENCOUNTER — Other Ambulatory Visit: Payer: Self-pay | Admitting: Family Medicine

## 2022-09-23 DIAGNOSIS — Z1231 Encounter for screening mammogram for malignant neoplasm of breast: Secondary | ICD-10-CM

## 2022-11-04 ENCOUNTER — Ambulatory Visit
Admission: RE | Admit: 2022-11-04 | Discharge: 2022-11-04 | Disposition: A | Discharge status: Home / Self Care | Payer: Medicare PPO | Source: Ambulatory Visit | Attending: Family Medicine | Admitting: Family Medicine

## 2022-11-04 DIAGNOSIS — Z1231 Encounter for screening mammogram for malignant neoplasm of breast: Secondary | ICD-10-CM | POA: Diagnosis not present

## 2023-01-01 ENCOUNTER — Other Ambulatory Visit: Payer: Self-pay | Admitting: Cardiovascular Disease

## 2023-01-01 DIAGNOSIS — I10 Essential (primary) hypertension: Secondary | ICD-10-CM

## 2023-01-03 ENCOUNTER — Other Ambulatory Visit: Payer: Self-pay | Admitting: Cardiovascular Disease

## 2023-01-07 ENCOUNTER — Ambulatory Visit: Payer: Medicare PPO | Admitting: Cardiology

## 2023-01-07 ENCOUNTER — Encounter: Payer: Self-pay | Admitting: Cardiology

## 2023-01-07 VITALS — BP 128/60 | HR 75 | Ht 62.0 in | Wt 139.0 lb

## 2023-01-07 DIAGNOSIS — E7800 Pure hypercholesterolemia, unspecified: Secondary | ICD-10-CM

## 2023-01-07 DIAGNOSIS — I480 Paroxysmal atrial fibrillation: Secondary | ICD-10-CM

## 2023-01-07 DIAGNOSIS — I10 Essential (primary) hypertension: Secondary | ICD-10-CM | POA: Diagnosis not present

## 2023-01-07 NOTE — Assessment & Plan Note (Signed)
Well controlled, continue hydralazine and metoprolol.

## 2023-01-07 NOTE — Assessment & Plan Note (Signed)
LDL 03/2022 60, continue atorvastatin 10 mg daily.

## 2023-01-07 NOTE — Progress Notes (Signed)
Cardiology Office Note   Date:  01/07/2023   ID:  Betty Nichols, DOB 1946/03/31, MRN [id number]  PCP:  Maryland Pink, MD  Cardiologist:  Elsworth Soho, NP      History of Present Illness: Betty Nichols is a 77 y.o. female who presents for  Chief Complaint  Patient presents with   Follow-up    4 Month Follow Up    Patient in office for routine cardiac exam. Denies chest pain, shortness of breath, dizziness, lower extremity edema.        Past Medical History:  Diagnosis Date   Chickenpox    Dysrhythmia    atrial fib   GERD (gastroesophageal reflux disease)    Hyperlipidemia    Hypertension    Osteopenia      Past Surgical History:  Procedure Laterality Date   CHOLESTEATOMA EXCISION     COLONOSCOPY  09/27/2013   COLONOSCOPY WITH PROPOFOL N/A 11/16/2018   Procedure: COLONOSCOPY WITH PROPOFOL;  Surgeon: Lollie Sails, MD;  Location: [facility] ENDOSCOPY;  Service: Endoscopy;  Laterality: N/A;     Current Outpatient Medications  Medication Sig Dispense Refill   atorvastatin (LIPITOR) 10 MG tablet Take 10 mg by mouth daily.     cholecalciferol (VITAMIN D3) 10 MCG (400 UNIT) TABS tablet Take 1,000 Units by mouth daily.     dofetilide (TIKOSYN) 250 MCG capsule Take 250 mcg by mouth 2 (two) times daily.     Ferrous Sulfate 140 (45 Fe) MG TBCR Take 140 mg by mouth daily.     glucosamine-chondroitin 500-400 MG tablet Take 1 tablet by mouth daily.     hydrALAZINE (APRESOLINE) 100 MG tablet TAKE ONE TABLET BY MOUTH TWICE A DAY 180 tablet 0   metoprolol succinate (TOPROL-XL) 50 MG 24 hr tablet TAKE ONE TABLET BY MOUTH ONCE A DAY 90 tablet 0   omeprazole (PRILOSEC) 20 MG capsule Take 20 mg by mouth daily.     rivaroxaban (XARELTO) 20 MG TABS tablet Take 20 mg by mouth daily with supper.     No current facility-administered medications for this visit.    Allergies:   Codeine    Social History:   reports that she has never smoked. She has never used smokeless  tobacco. She reports that she does not drink alcohol and does not use drugs.   Family History:  family history includes Breast cancer in her paternal aunt; Breast cancer (age of onset: 78) in her sister.    ROS:     Review of Systems  Constitutional: Negative.   HENT: Negative.    Respiratory: Negative.    Cardiovascular: Negative.   Gastrointestinal: Negative.   Skin: Negative.   Neurological:  Negative for dizziness.  All other systems reviewed and are negative.   All other systems are reviewed and negative.   PHYSICAL EXAM: VS:  BP 128/60   Pulse 75   Ht '5\' 2"'$  (1.575 m)   Wt 63 kg   SpO2 95%   BMI 25.42 kg/m  , BMI Body mass index is 25.42 kg/m. Last weight:  Wt Readings from Last 3 Encounters:  01/07/23 63 kg  11/16/18 72.6 kg    Physical Exam Vitals and nursing note reviewed.  Constitutional:      Appearance: Normal appearance. She is normal weight.  HENT:     Head: Normocephalic and atraumatic.  Cardiovascular:     Rate and Rhythm: Normal rate and regular rhythm.     Pulses: Normal pulses.  Heart sounds: Normal heart sounds.  Pulmonary:     Effort: Pulmonary effort is normal.     Breath sounds: Normal breath sounds.  Abdominal:     General: Abdomen is flat.     Palpations: Abdomen is soft.  Musculoskeletal:        General: Normal range of motion.     Cervical back: Normal range of motion.     Right lower leg: No edema.     Left lower leg: No edema.  Skin:    General: Skin is warm and dry.  Neurological:     General: No focal deficit present.     Mental Status: She is alert and oriented to person, place, and time. Mental status is at baseline.  Psychiatric:        Mood and Affect: Mood normal.        Behavior: Behavior normal.     EKG: none today  Recent Labs: No results found for requested labs within last 365 days.    Lipid Panel    Component Value Date/Time   CHOL 138 10/01/2012 0623   TRIG 96 10/01/2012 0623   HDL 47 10/01/2012  0623   VLDL 19 10/01/2012 0623   LDLCALC 72 10/01/2012 0623      Other studies Reviewed: Patient: [place] DOB:  27-Sep-1946  Date:  09/02/2022 11:00 Provider: Neoma Laming MD Encounter: ECHO   Page 2 REASON FOR VISIT  Visit for: Echocardiogram/I35.1  Sex:   Female   wt= 151   lbs.  BP=130/70  Height= 62   inches.   TESTS  Imaging: Echocardiogram:  An echocardiogram in (2-d) mode was performed and in Doppler mode with color flow velocity mapping was performed. The aortic valve cusps are abnormal 1.2   cm, flow velocity 123XX123  m/s, and systolic calculated mean flow gradient 17   mmHg. Mitral valve diastolic peak flow velocity E .666    m/s and E/A ratio 0.9. Aortic root diameter 2.8   cm. The LVOT internal diameter 2.6 cm and flow velocity was abnormal 1.60   m/s. LV systolic dimension A999333   cm, diastolic 99991111  cm, posterior wall thickness 1.45  cm, fractional shortening 43.4 %, and EF 75.4 %. IVS thickness 0.94  cm. LA dimension 3.9 cm. Mitral Valve has Mild Regurgitation. Aortic Valve has Trace to Mild Regurgitation. Pulmonic Valve has Trace Regurgitation. Tricuspid Valve has Trace Regurgitation.   ASSESSMENT  Technically adequate study.  Normal chamber sizes.  Normal left ventricular systolic function.  Mild left ventricular hypertrophy with GRADE 3 (restrictive physiology) diastolic dysfunction.  Normal right ventricular systolic function.  Normal right ventricular diastolic function.  Normal left ventricular wall motion.  Normal right ventricular wall motion.  Trace pulmonary regurgitation.  Trace tricuspid regurgitation.  Normal pulmonary artery pressure.  Mild mitral regurgitation.  trace to moderate aortic regurgitation.  No pericardial effusion.  Mild to moderate aortic stenosis  Mild LVH.   THERAPY   Referring physician: Dionisio David  Sonographer: Neoma Laming.   Neoma Laming MD  Electronically signed by: Neoma Laming     Date:  09/02/2022 15:06  Patient: [id number] - Betty Nichols DOB:  1946/01/18  Date:  10/06/2012 09:15 Provider: Humphrey Rolls, MD, [facility] Encounter: NUCLEAR STRESS TEST  TESTS   [facility] [address] [place], [place] [address] [phone] STUDY:  Rest / Gated Stress Myocardial Perfusion With Wall Motion, Left Ventricular Ejection Fraction SEX:  Female                                                                                                                                                                                                                  REFERRING PHYSICIAN:    Neoma Laming                                                                                                                                                                                                                    INDICATION FOR STUDY:    A-Fib. Chest discomfort.  TECHNIQUE:  Approximately 45 minutes following the intravenous administration of  13.2   mCi of Tc-3mSestamibi with the patient at rest, SPECT imaging of the heart was performed.  The patient then underwent stress testing.  At peak stress, the patient was injected intravenously with  33.2   mCi of Tc-922mestamibi.  Approximately 45 minutes later, gated SPECT imaging of the heart was performed.  STRESS BY:  ShNeoma LamingMD PROTOCOL:   BrDarnell Level                                                                                     MAX PRED HR: 155                     85%:   132             75%:  116                                                                                                                  RESTING BP: 128/78          RESTING HR:  60       PEAK BP:   152/54         PEAK HR:  139                                                                    EXERCISE DURATION: 5:44                                             METS:  7.1    REASON FOR TEST TERMINATION:   Target reached  SYMPTOMS: None  [facility] TREADMILL SCORE:       5                                 RISK:    Low                                                                                                                                                                                                         EKG RESULTS:  NSR. 60/min. RBBB. Non specific ST & T wave changes, no significant ST depression with exercise.  PERFUSION/WALL MOTION FINDINGS: EF = 92%. No perfusion defects.                                                                           IMPRESSION:   Normal study with normal EF.                                                                                                                                                                                                                                                                                       Neoma Laming, MD Stress Interpreting Physician / Nuclear Interpreting Physician        Neoma Laming, MD  Electronically signed by: Neoma Laming     Date: 11/01/2012 10:26   ASSESSMENT AND PLAN:    ICD-10-CM   1. Paroxysmal atrial fibrillation (HCC)  I48.0     2. High cholesterol  E78.00     3. Primary hypertension  I10        Problem List Items Addressed This Visit       Cardiovascular  and Mediastinum   Atrial fibrillation [facility]) - Primary    Patient doing well. No complaints. In sinus rhythm on auscultation. Continue Tikosyn and Xarelto.       Primary hypertension    Well controlled, continue hydralazine and metoprolol.         Other   High cholesterol    LDL 03/2022 60, continue atorvastatin 10 mg daily.           Disposition:   Return in about 4 months (around 05/09/2023).    Total time spent: 30 minutes  Signed,  [employer], NP  01/07/2023 10:21 AM    [place]

## 2023-01-07 NOTE — Assessment & Plan Note (Signed)
Patient doing well. No complaints. In sinus rhythm on auscultation. Continue Tikosyn and Xarelto.

## 2023-01-15 ENCOUNTER — Other Ambulatory Visit: Payer: Self-pay | Admitting: Cardiovascular Disease

## 2023-01-20 ENCOUNTER — Other Ambulatory Visit: Payer: Self-pay | Admitting: Cardiovascular Disease

## 2023-03-30 ENCOUNTER — Other Ambulatory Visit: Payer: Self-pay | Admitting: Cardiovascular Disease

## 2023-04-06 ENCOUNTER — Other Ambulatory Visit: Payer: Self-pay | Admitting: Cardiovascular Disease

## 2023-04-06 DIAGNOSIS — I10 Essential (primary) hypertension: Secondary | ICD-10-CM

## 2023-04-16 ENCOUNTER — Other Ambulatory Visit: Payer: Self-pay | Admitting: Cardiovascular Disease

## 2023-04-21 ENCOUNTER — Other Ambulatory Visit: Payer: Self-pay | Admitting: Cardiovascular Disease

## 2023-04-23 ENCOUNTER — Other Ambulatory Visit: Payer: Self-pay | Admitting: Cardiovascular Disease

## 2023-05-13 ENCOUNTER — Encounter: Payer: Self-pay | Admitting: Cardiology

## 2023-05-13 ENCOUNTER — Ambulatory Visit: Payer: Medicare PPO | Admitting: Cardiology

## 2023-05-13 VITALS — BP 130/60 | HR 65 | Ht 62.0 in | Wt 145.0 lb

## 2023-05-13 DIAGNOSIS — I350 Nonrheumatic aortic (valve) stenosis: Secondary | ICD-10-CM

## 2023-05-13 DIAGNOSIS — I480 Paroxysmal atrial fibrillation: Secondary | ICD-10-CM | POA: Diagnosis not present

## 2023-05-13 DIAGNOSIS — I10 Essential (primary) hypertension: Secondary | ICD-10-CM

## 2023-05-13 DIAGNOSIS — E7800 Pure hypercholesterolemia, unspecified: Secondary | ICD-10-CM | POA: Diagnosis not present

## 2023-05-13 NOTE — Assessment & Plan Note (Signed)
Well controlled, continue hydralazine and metoprolol.

## 2023-05-13 NOTE — Assessment & Plan Note (Signed)
Patient doing well. No complaints. In sinus rhythm on auscultation. Continue Tikosyn and Xarelto.

## 2023-05-13 NOTE — Assessment & Plan Note (Signed)
LDL 03/2022 60, continue atorvastatin 10 mg daily. Patient to make follow up appointment with PCP for yearly visit and blood work.

## 2023-05-13 NOTE — Assessment & Plan Note (Signed)
Echo 08/2022 trace to moderate aortic regurgitation, mild to moderate aortic stenosis

## 2023-05-13 NOTE — Progress Notes (Signed)
Cardiology Office Note   Date:  05/13/2023   ID:  Betty Nichols, DOB May 07, 1946, MRN [id number]  PCP:  Jerl Mina, MD  Cardiologist:  Marisue Ivan, NP      History of Present Illness: Betty Nichols is a 77 y.o. female who presents for  Chief Complaint  Patient presents with   Follow-up    4 mo F/U    Patient in office for routine cardiac exam. Denies chest pain, shortness of breath, dizziness, lower extremity edema.      Past Medical History:  Diagnosis Date   Chickenpox    Dysrhythmia    atrial fib   GERD (gastroesophageal reflux disease)    Hyperlipidemia    Hypertension    Osteopenia      Past Surgical History:  Procedure Laterality Date   CHOLESTEATOMA EXCISION     COLONOSCOPY  09/27/2013   COLONOSCOPY WITH PROPOFOL N/A 11/16/2018   Procedure: COLONOSCOPY WITH PROPOFOL;  Surgeon: Christena Deem, MD;  Location: [facility] ENDOSCOPY;  Service: Endoscopy;  Laterality: N/A;     Current Outpatient Medications  Medication Sig Dispense Refill   atorvastatin (LIPITOR) 10 MG tablet Take 10 mg by mouth daily.     cholecalciferol (VITAMIN D3) 10 MCG (400 UNIT) TABS tablet Take 1,000 Units by mouth daily.     dofetilide (TIKOSYN) 250 MCG capsule TAKE ONE CAPSULE BY MOUTH TWICE DAILY 180 capsule 0   Ferrous Sulfate 140 (45 Fe) MG TBCR Take 140 mg by mouth daily.     glucosamine-chondroitin 500-400 MG tablet Take 1 tablet by mouth daily.     hydrALAZINE (APRESOLINE) 100 MG tablet TAKE ONE TABLET BY MOUTH TWICE A DAY 180 tablet 0   hydrochlorothiazide (MICROZIDE) 12.5 MG capsule TAKE ONE CAPSULE BY MOUTH ONCE DAILY 90 capsule 0   metoprolol succinate (TOPROL-XL) 50 MG 24 hr tablet TAKE ONE TABLET BY MOUTH ONCE A DAY 90 tablet 0   omeprazole (PRILOSEC) 20 MG capsule Take 20 mg by mouth daily.     XARELTO 20 MG TABS tablet TAKE ONE TABLET BY MOUTH ONCE A DAY 90 tablet 0   No current facility-administered medications for this visit.    Allergies:   Codeine     Social History:   reports that she has never smoked. She has never used smokeless tobacco. She reports that she does not drink alcohol and does not use drugs.   Family History:  family history includes Breast cancer in her paternal aunt; Breast cancer (age of onset: 78) in her sister.    ROS:     Review of Systems  Constitutional: Negative.   HENT: Negative.    Eyes: Negative.   Respiratory: Negative.    Cardiovascular: Negative.   Gastrointestinal: Negative.   Genitourinary: Negative.   Musculoskeletal: Negative.   Skin: Negative.   Endo/Heme/Allergies: Negative.   Psychiatric/Behavioral: Negative.    All other systems reviewed and are negative.    All other systems are reviewed and negative.    PHYSICAL EXAM: VS:  BP 130/60   Pulse 65   Ht 5\' 2"  (1.575 m)   Wt 145 lb (65.8 kg)   SpO2 98%   BMI 26.52 kg/m  , BMI Body mass index is 26.52 kg/m. Last weight:  Wt Readings from Last 3 Encounters:  05/13/23 145 lb (65.8 kg)  01/07/23 139 lb (63 kg)  11/16/18 160 lb (72.6 kg)     Physical Exam Vitals and nursing note reviewed.  Constitutional:  Appearance: Normal appearance. She is normal weight.  HENT:     Head: Normocephalic and atraumatic.  Cardiovascular:     Rate and Rhythm: Normal rate and regular rhythm.     Pulses: Normal pulses.     Heart sounds: Normal heart sounds.  Pulmonary:     Effort: Pulmonary effort is normal.     Breath sounds: Normal breath sounds.  Abdominal:     General: Abdomen is flat.     Palpations: Abdomen is soft.  Musculoskeletal:        General: Normal range of motion.     Cervical back: Normal range of motion.  Skin:    General: Skin is warm and dry.  Neurological:     General: No focal deficit present.     Mental Status: She is alert and oriented to person, place, and time. Mental status is at baseline.  Psychiatric:        Mood and Affect: Mood normal.        Behavior: Behavior normal.     EKG: none  today  Recent Labs: No results found for requested labs within last 365 days.    Lipid Panel    Component Value Date/Time   CHOL 138 10/01/2012 0623   TRIG 96 10/01/2012 0623   HDL 47 10/01/2012 0623   VLDL 19 10/01/2012 0623   LDLCALC 72 10/01/2012 0623      ASSESSMENT AND PLAN:    ICD-10-CM   1. Paroxysmal atrial fibrillation (HCC)  I48.0     2. Primary hypertension  I10 PCV ECHOCARDIOGRAM COMPLETE    3. High cholesterol  E78.00     4. Nonrheumatic aortic valve stenosis  I35.0 PCV ECHOCARDIOGRAM COMPLETE       Problem List Items Addressed This Visit       Cardiovascular and Mediastinum   Atrial fibrillation [facility]) - Primary    Patient doing well. No complaints. In sinus rhythm on auscultation. Continue Tikosyn and Xarelto.       Primary hypertension    Well controlled, continue hydralazine and metoprolol.       Relevant Orders   PCV ECHOCARDIOGRAM COMPLETE   Nonrheumatic aortic valve stenosis    Echo 08/2022 trace to moderate aortic regurgitation, mild to moderate aortic stenosis       Relevant Orders   PCV ECHOCARDIOGRAM COMPLETE     Other   High cholesterol    LDL 03/2022 60, continue atorvastatin 10 mg daily. Patient to make follow up appointment with PCP for yearly visit and blood work.           Disposition:   Return in about 4 months (around 09/13/2023) for with echo prior.    Total time spent: 30 minutes  Signed,  [employer], NP  05/13/2023 9:27 AM    [facility]

## 2023-06-24 ENCOUNTER — Other Ambulatory Visit: Payer: Self-pay | Admitting: Cardiovascular Disease

## 2023-07-02 ENCOUNTER — Other Ambulatory Visit: Payer: Self-pay | Admitting: Cardiovascular Disease

## 2023-07-02 DIAGNOSIS — I10 Essential (primary) hypertension: Secondary | ICD-10-CM

## 2023-07-06 ENCOUNTER — Other Ambulatory Visit: Payer: Self-pay | Admitting: Cardiovascular Disease

## 2023-07-06 DIAGNOSIS — I10 Essential (primary) hypertension: Secondary | ICD-10-CM

## 2023-07-13 ENCOUNTER — Other Ambulatory Visit: Payer: Self-pay | Admitting: Cardiovascular Disease

## 2023-08-10 ENCOUNTER — Other Ambulatory Visit: Payer: Self-pay | Admitting: Cardiovascular Disease

## 2023-09-08 ENCOUNTER — Ambulatory Visit (INDEPENDENT_AMBULATORY_CARE_PROVIDER_SITE_OTHER): Payer: Medicare PPO

## 2023-09-08 DIAGNOSIS — I422 Other hypertrophic cardiomyopathy: Secondary | ICD-10-CM

## 2023-09-08 DIAGNOSIS — I340 Nonrheumatic mitral (valve) insufficiency: Secondary | ICD-10-CM

## 2023-09-08 DIAGNOSIS — I351 Nonrheumatic aortic (valve) insufficiency: Secondary | ICD-10-CM | POA: Diagnosis not present

## 2023-09-08 DIAGNOSIS — I350 Nonrheumatic aortic (valve) stenosis: Secondary | ICD-10-CM

## 2023-09-08 DIAGNOSIS — I10 Essential (primary) hypertension: Secondary | ICD-10-CM

## 2023-09-16 ENCOUNTER — Ambulatory Visit: Payer: Medicare PPO | Admitting: Cardiology

## 2023-09-16 ENCOUNTER — Encounter: Payer: Self-pay | Admitting: Cardiology

## 2023-09-16 VITALS — BP 112/62 | HR 58 | Ht 62.5 in | Wt 145.0 lb

## 2023-09-16 DIAGNOSIS — I350 Nonrheumatic aortic (valve) stenosis: Secondary | ICD-10-CM

## 2023-09-16 DIAGNOSIS — I480 Paroxysmal atrial fibrillation: Secondary | ICD-10-CM | POA: Diagnosis not present

## 2023-09-16 DIAGNOSIS — I10 Essential (primary) hypertension: Secondary | ICD-10-CM

## 2023-09-16 DIAGNOSIS — I340 Nonrheumatic mitral (valve) insufficiency: Secondary | ICD-10-CM | POA: Insufficient documentation

## 2023-09-16 NOTE — Assessment & Plan Note (Signed)
Echo 08/2023 mild aortic regurgitation, mild aortic stenosis

## 2023-09-16 NOTE — Assessment & Plan Note (Signed)
Patient doing well. No complaints. In sinus rhythm on auscultation. Continue Tikosyn and Xarelto.

## 2023-09-16 NOTE — Assessment & Plan Note (Signed)
Echo 08/2023, mild  MVR

## 2023-09-16 NOTE — Assessment & Plan Note (Signed)
Well controlled, continue hydralazine and metoprolol.

## 2023-09-16 NOTE — Progress Notes (Signed)
Cardiology Office Note   Date:  09/16/2023   ID:  Betty Nichols, DOB 01/30/1946, MRN [id number]  PCP:  Jerl Mina, MD  Cardiologist:  Marisue Ivan, NP      History of Present Illness: Betty Nichols is a 77 y.o. female who presents for  Chief Complaint  Patient presents with   Follow-up    4 month follow up    Patient in office for routine cardiac exam. Denies chest pain, shortness of breath, dizziness, lower extremity edema. Discuss recent echo results.       Past Medical History:  Diagnosis Date   Chickenpox    Dysrhythmia    atrial fib   GERD (gastroesophageal reflux disease)    Hyperlipidemia    Hypertension    Osteopenia      Past Surgical History:  Procedure Laterality Date   CHOLESTEATOMA EXCISION     COLONOSCOPY  09/27/2013   COLONOSCOPY WITH PROPOFOL N/A 11/16/2018   Procedure: COLONOSCOPY WITH PROPOFOL;  Surgeon: Christena Deem, MD;  Location: [facility] ENDOSCOPY;  Service: Endoscopy;  Laterality: N/A;     Current Outpatient Medications  Medication Sig Dispense Refill   atorvastatin (LIPITOR) 10 MG tablet Take 10 mg by mouth daily.     cholecalciferol (VITAMIN D3) 10 MCG (400 UNIT) TABS tablet Take 1,000 Units by mouth daily.     dofetilide (TIKOSYN) 250 MCG capsule TAKE ONE CAPSULE BY MOUTH TWICE DAILY 180 capsule 0   Ferrous Sulfate 140 (45 Fe) MG TBCR Take 140 mg by mouth daily.     glucosamine-chondroitin 500-400 MG tablet Take 1 tablet by mouth daily.     hydrALAZINE (APRESOLINE) 100 MG tablet TAKE ONE TABLET BY MOUTH TWICE A DAY 180 tablet 0   hydrochlorothiazide (MICROZIDE) 12.5 MG capsule TAKE ONE CAPSULE BY MOUTH ONCE DAILY 90 capsule 0   magnesium oxide (MAG-OX) 400 (240 Mg) MG tablet Take 400 mg by mouth daily.     metoprolol succinate (TOPROL-XL) 50 MG 24 hr tablet TAKE ONE TABLET BY MOUTH ONCE A DAY 90 tablet 0   omeprazole (PRILOSEC) 20 MG capsule Take 20 mg by mouth daily.     XARELTO 20 MG TABS tablet TAKE ONE TABLET BY  MOUTH ONCE A DAY 90 tablet 0   No current facility-administered medications for this visit.    Allergies:   Codeine    Social History:   reports that she has never smoked. She has never used smokeless tobacco. She reports that she does not drink alcohol and does not use drugs.   Family History:  family history includes Breast cancer in her paternal aunt; Breast cancer (age of onset: 27) in her sister.    ROS:     Review of Systems  Constitutional: Negative.   HENT: Negative.    Eyes: Negative.   Respiratory: Negative.    Cardiovascular: Negative.   Gastrointestinal: Negative.   Genitourinary: Negative.   Musculoskeletal: Negative.   Skin: Negative.   Neurological: Negative.   Endo/Heme/Allergies: Negative.   Psychiatric/Behavioral: Negative.    All other systems reviewed and are negative.     All other systems are reviewed and negative.    PHYSICAL EXAM: VS:  BP 112/62   Pulse (!) 58   Ht 5' 2.5" (1.588 m)   Wt 145 lb (65.8 kg)   SpO2 95%   BMI 26.10 kg/m  , BMI Body mass index is 26.1 kg/m. Last weight:  Wt Readings from Last 3 Encounters:  09/16/23 145  lb (65.8 kg)  05/13/23 145 lb (65.8 kg)  01/07/23 139 lb (63 kg)     Physical Exam Vitals and nursing note reviewed.  Constitutional:      Appearance: Normal appearance. She is normal weight.  HENT:     Head: Normocephalic and atraumatic.     Nose: Nose normal.     Mouth/Throat:     Mouth: Mucous membranes are moist.     Pharynx: Oropharynx is clear.  Eyes:     Conjunctiva/sclera: Conjunctivae normal.     Pupils: Pupils are equal, round, and reactive to light.  Cardiovascular:     Rate and Rhythm: Normal rate and regular rhythm.     Pulses: Normal pulses.     Heart sounds: Normal heart sounds.  Pulmonary:     Effort: Pulmonary effort is normal.     Breath sounds: Normal breath sounds.  Abdominal:     General: Abdomen is flat. Bowel sounds are normal.     Palpations: Abdomen is soft.   Musculoskeletal:        General: Normal range of motion.     Cervical back: Normal range of motion.  Skin:    General: Skin is warm and dry.  Neurological:     General: No focal deficit present.     Mental Status: She is alert and oriented to person, place, and time. Mental status is at baseline.  Psychiatric:        Mood and Affect: Mood normal.        Behavior: Behavior normal.      EKG: none today  Recent Labs: No results found for requested labs within last 365 days.    Lipid Panel    Component Value Date/Time   CHOL 138 10/01/2012 0623   TRIG 96 10/01/2012 0623   HDL 47 10/01/2012 0623   VLDL 19 10/01/2012 0623   LDLCALC 72 10/01/2012 0623      Other studies Reviewed: echo 08/2023    ASSESSMENT AND PLAN:    ICD-10-CM   1. Paroxysmal atrial fibrillation (HCC)  I48.0     2. Primary hypertension  I10     3. Nonrheumatic aortic valve stenosis  I35.0     4. Nonrheumatic mitral valve regurgitation  I34.0        Problem List Items Addressed This Visit       Cardiovascular and Mediastinum   Atrial fibrillation [facility]) - Primary    Patient doing well. No complaints. In sinus rhythm on auscultation. Continue Tikosyn and Xarelto.       Primary hypertension    Well controlled, continue hydralazine and metoprolol.       Nonrheumatic aortic valve stenosis    Echo 08/2023 mild aortic regurgitation, mild aortic stenosis       Nonrheumatic mitral valve regurgitation    Echo 08/2023, mild  MVR        Disposition:   Return in about 4 months (around 01/14/2024).    Total time spent: 25 minutes  Signed,  [employer], NP  09/16/2023 10:01 AM    [facility]

## 2023-09-19 ENCOUNTER — Other Ambulatory Visit: Payer: Self-pay | Admitting: Cardiovascular Disease

## 2023-09-23 ENCOUNTER — Other Ambulatory Visit: Payer: Self-pay | Admitting: Cardiovascular Disease

## 2023-09-28 ENCOUNTER — Other Ambulatory Visit: Payer: Self-pay | Admitting: Family Medicine

## 2023-09-28 DIAGNOSIS — Z1231 Encounter for screening mammogram for malignant neoplasm of breast: Secondary | ICD-10-CM

## 2023-09-30 ENCOUNTER — Other Ambulatory Visit: Payer: Self-pay | Admitting: Cardiovascular Disease

## 2023-11-04 ENCOUNTER — Other Ambulatory Visit: Payer: Self-pay | Admitting: Cardiovascular Disease

## 2023-11-10 ENCOUNTER — Ambulatory Visit
Admission: RE | Admit: 2023-11-10 | Discharge: 2023-11-10 | Disposition: A | Discharge status: Home / Self Care | Payer: Medicare PPO | Source: Ambulatory Visit | Attending: Family Medicine | Admitting: Family Medicine

## 2023-11-10 DIAGNOSIS — Z1231 Encounter for screening mammogram for malignant neoplasm of breast: Secondary | ICD-10-CM | POA: Insufficient documentation

## 2023-11-12 ENCOUNTER — Other Ambulatory Visit: Payer: Self-pay | Admitting: Cardiovascular Disease

## 2023-12-06 ENCOUNTER — Emergency Department: Payer: Medicare PPO

## 2023-12-06 ENCOUNTER — Other Ambulatory Visit: Payer: Self-pay

## 2023-12-06 ENCOUNTER — Emergency Department
Admission: EM | Admit: 2023-12-06 | Discharge: 2023-12-06 | Disposition: A | Discharge status: Home / Self Care | Payer: Medicare PPO | Attending: Emergency Medicine | Admitting: Emergency Medicine

## 2023-12-06 DIAGNOSIS — R079 Chest pain, unspecified: Secondary | ICD-10-CM

## 2023-12-06 DIAGNOSIS — R0789 Other chest pain: Secondary | ICD-10-CM | POA: Insufficient documentation

## 2023-12-06 DIAGNOSIS — Z7901 Long term (current) use of anticoagulants: Secondary | ICD-10-CM | POA: Diagnosis not present

## 2023-12-06 DIAGNOSIS — I10 Essential (primary) hypertension: Secondary | ICD-10-CM | POA: Insufficient documentation

## 2023-12-06 LAB — COMPREHENSIVE METABOLIC PANEL
ALT: 14 U/L (ref 0–44)
AST: 17 U/L (ref 15–41)
Albumin: 3.9 g/dL (ref 3.5–5.0)
Alkaline Phosphatase: 53 U/L (ref 38–126)
Anion gap: 9 (ref 5–15)
BUN: 21 mg/dL (ref 8–23)
CO2: 25 mmol/L (ref 22–32)
Calcium: 8.9 mg/dL (ref 8.9–10.3)
Chloride: 102 mmol/L (ref 98–111)
Creatinine, Ser: 1.08 mg/dL — ABNORMAL HIGH (ref 0.44–1.00)
GFR, Estimated: 53 mL/min — ABNORMAL LOW (ref >60)
Glucose, Bld: 144 mg/dL — ABNORMAL HIGH (ref 70–99)
Potassium: 3.2 mmol/L — ABNORMAL LOW (ref 3.5–5.1)
Sodium: 136 mmol/L (ref 135–145)
Total Bilirubin: 0.3 mg/dL (ref 0.0–1.2)
Total Protein: 7.6 g/dL (ref 6.5–8.1)

## 2023-12-06 LAB — CBC
HCT: 35.2 % — ABNORMAL LOW (ref 36.0–46.0)
Hemoglobin: 11.4 g/dL — ABNORMAL LOW (ref 12.0–15.0)
MCH: 29.8 pg (ref 26.0–34.0)
MCHC: 32.4 g/dL (ref 30.0–36.0)
MCV: 91.9 fL (ref 80.0–100.0)
Platelets: 259 10*3/uL (ref 150–400)
RBC: 3.83 MIL/uL — ABNORMAL LOW (ref 3.87–5.11)
RDW: 13.1 % (ref 11.5–15.5)
WBC: 11.2 10*3/uL — ABNORMAL HIGH (ref 4.0–10.5)
nRBC: 0 % (ref 0.0–0.2)

## 2023-12-06 LAB — TROPONIN I (HIGH SENSITIVITY)
Troponin I (High Sensitivity): 5 ng/L (ref <18)
Troponin I (High Sensitivity): 6 ng/L (ref <18)

## 2023-12-06 MED ORDER — HYDROCODONE-ACETAMINOPHEN 5-325 MG PO TABS: 1.0000 | ORAL_TABLET | ORAL | 0 refills | Status: DC | PRN

## 2023-12-06 MED ORDER — HYDROCODONE-ACETAMINOPHEN 5-325 MG PO TABS: 1.0000 | ORAL_TABLET | ORAL | 0 refills | Status: AC | PRN

## 2023-12-06 MED ORDER — FENTANYL CITRATE PF 50 MCG/ML IJ SOSY
50.0000 ug | PREFILLED_SYRINGE | Freq: Once | INTRAMUSCULAR | Status: AC
  Administered 2023-12-06: 50 ug via INTRAVENOUS
  Filled 2023-12-06: qty 1

## 2023-12-06 NOTE — ED Provider Notes (Signed)
 [facility] Provider Note    Event Date/Time   First MD Initiated Contact with Patient 12/06/23 [phone]     (approximate)  History   Chief Complaint: Chest Pain  HPI  Betty Nichols is a 78 y.o. female with a past medical history of gastric reflux, hypertension, hyperlipidemia, paroxysmal atrial fibrillation on Xarelto, presents to the emergency department for chest pain.  According to the patient since early this morning she has been experiencing pain in the center of her chest.  Patient describes the pain as sharp and stabbing only lasts for less than a second when it occurs.  Patient had 2 or 3 episodes of this chest pain in which she reacts by flinching when the pain occurs states it lasts less than a second and goes away.  Patient denies any nausea or diaphoresis.  States she has had an upper respiratory infection for the last 3 weeks with coughing although states over the last few days it has improved dramatically.  Pain is worse with movement or if she pushes on the chest.  Physical Exam   Triage Vital Signs: ED Triage Vitals  Encounter Vitals Group     BP 12/06/23 0947 (!) 135/46     Systolic BP Percentile --      Diastolic BP Percentile --      Pulse Rate 12/06/23 0947 76     Resp 12/06/23 0947 18     Temp 12/06/23 0947 98.2 F (36.8 C)     Temp Source 12/06/23 0947 Oral     SpO2 12/06/23 0947 100 %     Weight 12/06/23 0944 140 lb (63.5 kg)     Height 12/06/23 0944 5' 2 (1.575 m)     Head Circumference --      Peak Flow --      Pain Score 12/06/23 0944 8     Pain Loc --      Pain Education --      Exclude from Growth Chart --     Most recent vital signs: Vitals:   12/06/23 0947  BP: (!) 135/46  Pulse: 76  Resp: 18  Temp: 98.2 F (36.8 C)  SpO2: 100%    General: Awake, no distress.  CV:  Good peripheral perfusion.  Regular rate and rhythm  Resp:  Normal effort.  Equal breath sounds bilaterally.  Abd:  No distention.  Soft,  nontender.  No rebound or guarding. Other:  No leg pain or swelling.   ED Results / Procedures / Treatments   EKG  EKG viewed and interpreted by myself shows a sinus rhythm at 75 bpm with a slightly widened QRS, normal axis, largely normal intervals with nonspecific ST changes.  RADIOLOGY  I reviewed and interpreted chest x-ray images.  No consolidation seen in my evaluation. Radiology is read the x-ray is negative   MEDICATIONS ORDERED IN ED: Medications  fentaNYL  (SUBLIMAZE ) injection 50 mcg (has no administration in time range)     IMPRESSION / MDM / ASSESSMENT AND PLAN / ED COURSE  I reviewed the triage vital signs and the nursing notes.  Patient's presentation is most consistent with acute presentation with potential threat to life or bodily function.  Patient presents to the emergency department for chest pain that occurs intermittently and lasts less than a second.  Patient has moderate central chest wall tenderness to palpation.  Very reproducible on exam.  Given the patient's recent upper respiratory infection with 3 weeks of coughing I suspect  that the patient's discomfort could be related to musculoskeletal pain given its reproducibility.  However given the patient's age and comorbidities we will check labs including cardiac enzymes obtain a chest x-ray and continue to closely monitor.  We will treat pain while awaiting results.  Patient agreeable to plan of care.  Patient's lab work is reassuring, CBC is normal, chemistry reassuring.  Troponin negative x 2.  Chest x-ray is clear and EKG shows no significant finding.  Highly suspect more chest wall discomfort given the reproducibility on exam and recent upper respiratory infection.  As a workup is otherwise reassuring I believe the patient can be safely discharged home with PCP follow-up.  Patient agreeable to plan of care and workup.  FINAL CLINICAL IMPRESSION(S) / ED DIAGNOSES   Central chest pain   Note:  This  document was prepared using Dragon voice recognition software and may include unintentional dictation errors.   Dorothyann Drivers, MD 12/06/23 1351

## 2023-12-06 NOTE — ED Notes (Signed)
 This RN called Lab to get a updated on collected labs

## 2023-12-06 NOTE — ED Triage Notes (Signed)
 Pt bib [facility] from home c/o fluctuating chest pain that originate from the back into central chest since 6 am this morning. Pt recently had a sinus infection and has a hx of AFIB. Denies nvd, visual changes or swelling. Pt received 324mg  Aspirin by EMS.  142/58 76 HR 98 RA

## 2023-12-28 ENCOUNTER — Other Ambulatory Visit: Payer: Self-pay | Admitting: Cardiovascular Disease

## 2023-12-30 ENCOUNTER — Other Ambulatory Visit: Payer: Self-pay | Admitting: Cardiovascular Disease

## 2023-12-30 DIAGNOSIS — I10 Essential (primary) hypertension: Secondary | ICD-10-CM

## 2024-01-04 ENCOUNTER — Other Ambulatory Visit: Payer: Self-pay | Admitting: Cardiovascular Disease

## 2024-01-04 DIAGNOSIS — I10 Essential (primary) hypertension: Secondary | ICD-10-CM

## 2024-01-13 ENCOUNTER — Ambulatory Visit: Payer: Medicare PPO | Admitting: Cardiology

## 2024-01-13 ENCOUNTER — Encounter: Payer: Self-pay | Admitting: Cardiology

## 2024-01-13 VITALS — BP 124/62 | HR 62 | Ht 62.5 in | Wt 140.8 lb

## 2024-01-13 DIAGNOSIS — I350 Nonrheumatic aortic (valve) stenosis: Secondary | ICD-10-CM | POA: Diagnosis not present

## 2024-01-13 DIAGNOSIS — I10 Essential (primary) hypertension: Secondary | ICD-10-CM | POA: Diagnosis not present

## 2024-01-13 DIAGNOSIS — E7800 Pure hypercholesterolemia, unspecified: Secondary | ICD-10-CM

## 2024-01-13 DIAGNOSIS — I340 Nonrheumatic mitral (valve) insufficiency: Secondary | ICD-10-CM | POA: Diagnosis not present

## 2024-01-13 DIAGNOSIS — I480 Paroxysmal atrial fibrillation: Secondary | ICD-10-CM

## 2024-01-13 NOTE — Assessment & Plan Note (Signed)
Well controlled, continue hydralazine and metoprolol.

## 2024-01-13 NOTE — Assessment & Plan Note (Signed)
 Echo 08/2023 mild aortic regurgitation, mild aortic stenosis

## 2024-01-13 NOTE — Assessment & Plan Note (Signed)
 Echo 08/2023, mild  MVR

## 2024-01-13 NOTE — Progress Notes (Signed)
 Cardiology Office Note   Date:  01/13/2024   ID:  AARYN PARRILLA, DOB 1945/12/10, MRN [id number]  PCP:  Jerl Mina, MD  Cardiologist:  Marisue Ivan, NP      History of Present Illness: Betty Nichols is a 78 y.o. female who presents for  Chief Complaint  Patient presents with   Follow-up    4 month cardio follow up    Patient in office for routine cardiac exam. Denies chest pain, shortness of breath, dizziness, lower extremity edema.       Past Medical History:  Diagnosis Date   Chickenpox    Dysrhythmia    atrial fib   GERD (gastroesophageal reflux disease)    Hyperlipidemia    Hypertension    Osteopenia      Past Surgical History:  Procedure Laterality Date   CHOLESTEATOMA EXCISION     COLONOSCOPY  09/27/2013   COLONOSCOPY WITH PROPOFOL N/A 11/16/2018   Procedure: COLONOSCOPY WITH PROPOFOL;  Surgeon: Christena Deem, MD;  Location: [facility] ENDOSCOPY;  Service: Endoscopy;  Laterality: N/A;     Current Outpatient Medications  Medication Sig Dispense Refill   atorvastatin (LIPITOR) 10 MG tablet Take 10 mg by mouth daily.     cholecalciferol (VITAMIN D3) 10 MCG (400 UNIT) TABS tablet Take 1,000 Units by mouth daily.     dofetilide (TIKOSYN) 250 MCG capsule TAKE ONE CAPSULE BY MOUTH TWICE DAILY 180 capsule 0   Ferrous Sulfate 140 (45 Fe) MG TBCR Take 140 mg by mouth daily.     glucosamine-chondroitin 500-400 MG tablet Take 1 tablet by mouth daily.     hydrALAZINE (APRESOLINE) 100 MG tablet TAKE ONE TABLET BY MOUTH TWICE A DAY 180 tablet 0   hydrochlorothiazide (MICROZIDE) 12.5 MG capsule TAKE ONE CAPSULE BY MOUTH ONCE DAILY 90 capsule 0   HYDROcodone-acetaminophen (NORCO/VICODIN) 5-325 MG tablet Take 1 tablet by mouth every 4 (four) hours as needed. 12 tablet 0   magnesium oxide (MAG-OX) 400 (240 Mg) MG tablet Take 400 mg by mouth daily.     metoprolol succinate (TOPROL-XL) 50 MG 24 hr tablet TAKE ONE TABLET BY MOUTH ONCE A DAY 90 tablet 0   omeprazole  (PRILOSEC) 20 MG capsule Take 20 mg by mouth daily.     XARELTO 20 MG TABS tablet TAKE ONE TABLET BY MOUTH ONCE A DAY 90 tablet 0   No current facility-administered medications for this visit.    Allergies:   Codeine    Social History:   reports that she has never smoked. She has never used smokeless tobacco. She reports that she does not drink alcohol and does not use drugs.   Family History:  family history includes Breast cancer in her paternal aunt; Breast cancer (age of onset: 86) in her sister.    ROS:     Review of Systems  Constitutional: Negative.   HENT: Negative.    Eyes: Negative.   Respiratory: Negative.    Cardiovascular: Negative.   Gastrointestinal: Negative.   Genitourinary: Negative.   Musculoskeletal: Negative.   Skin: Negative.   Neurological: Negative.   Endo/Heme/Allergies: Negative.   Psychiatric/Behavioral: Negative.    All other systems reviewed and are negative.     All other systems are reviewed and negative.    PHYSICAL EXAM: VS:  BP 124/62   Pulse 62   Ht 5' 2.5" (1.588 m)   Wt 140 lb 12.8 oz (63.9 kg)   SpO2 99%   BMI 25.34 kg/m  ,  BMI Body mass index is 25.34 kg/m. Last weight:  Wt Readings from Last 3 Encounters:  01/13/24 140 lb 12.8 oz (63.9 kg)  12/06/23 140 lb (63.5 kg)  09/16/23 145 lb (65.8 kg)     Physical Exam Vitals and nursing note reviewed.  Constitutional:      Appearance: Normal appearance. She is normal weight.  HENT:     Head: Normocephalic and atraumatic.     Nose: Nose normal.     Mouth/Throat:     Mouth: Mucous membranes are moist.     Pharynx: Oropharynx is clear.  Eyes:     Conjunctiva/sclera: Conjunctivae normal.     Pupils: Pupils are equal, round, and reactive to light.  Cardiovascular:     Rate and Rhythm: Normal rate and regular rhythm.     Pulses: Normal pulses.     Heart sounds: Murmur heard.  Pulmonary:     Effort: Pulmonary effort is normal.     Breath sounds: Normal breath sounds.   Abdominal:     General: Abdomen is flat. Bowel sounds are normal.     Palpations: Abdomen is soft.  Musculoskeletal:        General: Normal range of motion.     Cervical back: Normal range of motion.  Skin:    General: Skin is warm and dry.  Neurological:     General: No focal deficit present.     Mental Status: She is alert and oriented to person, place, and time. Mental status is at baseline.  Psychiatric:        Mood and Affect: Mood normal.        Behavior: Behavior normal.      EKG: none today  Recent Labs: 12/06/2023: ALT 14; BUN 21; Creatinine, Ser 1.08; Hemoglobin 11.4; Platelets 259; Potassium 3.2; Sodium 136    Lipid Panel    Component Value Date/Time   CHOL 138 10/01/2012 0623   TRIG 96 10/01/2012 0623   HDL 47 10/01/2012 0623   VLDL 19 10/01/2012 0623   LDLCALC 72 10/01/2012 0623      ASSESSMENT AND PLAN:    ICD-10-CM   1. Paroxysmal atrial fibrillation (HCC)  I48.0     2. Nonrheumatic aortic valve stenosis  I35.0     3. Nonrheumatic mitral valve regurgitation  I34.0     4. Primary hypertension  I10     5. High cholesterol  E78.00        Problem List Items Addressed This Visit       Cardiovascular and Mediastinum   Atrial fibrillation [facility]) - Primary   Patient doing well. No complaints. In sinus rhythm on auscultation. Continue Tikosyn and Xarelto.       Primary hypertension   Well controlled, continue hydralazine and metoprolol.       Nonrheumatic aortic valve stenosis   Echo 08/2023 mild aortic regurgitation, mild aortic stenosis       Nonrheumatic mitral valve regurgitation   Echo 08/2023, mild  MVR        Other   High cholesterol   LDL 08/2023 74, continue atorvastatin 10 mg daily.         Disposition:   Return in about 4 months (around 05/14/2024) for cards.    Total time spent: 25 minutes  Signed,  Google, NP  01/13/2024 9:08 AM    [facility]

## 2024-01-13 NOTE — Assessment & Plan Note (Signed)
Patient doing well. No complaints. In sinus rhythm on auscultation. Continue Tikosyn and Xarelto.

## 2024-01-13 NOTE — Assessment & Plan Note (Signed)
 LDL 08/2023 74, continue atorvastatin 10 mg daily.

## 2024-02-08 ENCOUNTER — Other Ambulatory Visit: Payer: Self-pay | Admitting: Cardiovascular Disease

## 2024-02-09 ENCOUNTER — Other Ambulatory Visit: Payer: Self-pay | Admitting: Cardiovascular Disease

## 2024-03-10 ENCOUNTER — Other Ambulatory Visit: Payer: Self-pay | Admitting: Cardiovascular Disease

## 2024-03-18 ENCOUNTER — Other Ambulatory Visit: Payer: Self-pay | Admitting: Cardiology

## 2024-03-22 ENCOUNTER — Other Ambulatory Visit: Payer: Self-pay | Admitting: Cardiovascular Disease

## 2024-04-01 ENCOUNTER — Other Ambulatory Visit: Payer: Self-pay | Admitting: Cardiovascular Disease

## 2024-04-01 DIAGNOSIS — I10 Essential (primary) hypertension: Secondary | ICD-10-CM

## 2024-05-24 ENCOUNTER — Ambulatory Visit: Admitting: Cardiovascular Disease

## 2024-06-13 ENCOUNTER — Other Ambulatory Visit: Payer: Self-pay | Admitting: Cardiovascular Disease

## 2024-06-14 ENCOUNTER — Ambulatory Visit: Admitting: Cardiovascular Disease

## 2024-06-14 ENCOUNTER — Encounter: Payer: Self-pay | Admitting: Cardiovascular Disease

## 2024-06-14 VITALS — BP 138/54 | HR 76 | Ht 62.0 in | Wt 146.8 lb

## 2024-06-14 DIAGNOSIS — I10 Essential (primary) hypertension: Secondary | ICD-10-CM | POA: Diagnosis not present

## 2024-06-14 DIAGNOSIS — I340 Nonrheumatic mitral (valve) insufficiency: Secondary | ICD-10-CM

## 2024-06-14 DIAGNOSIS — I350 Nonrheumatic aortic (valve) stenosis: Secondary | ICD-10-CM | POA: Diagnosis not present

## 2024-06-14 DIAGNOSIS — I480 Paroxysmal atrial fibrillation: Secondary | ICD-10-CM

## 2024-06-14 NOTE — Progress Notes (Signed)
 Cardiology Office Note   Date:  06/14/2024   ID:  BLEN RANSOME, DOB 1946/06/30, MRN [id number]  PCP:  Valora Agent, MD  Cardiologist:  Denyse Bathe, MD      History of Present Illness: Betty Nichols is a 78 y.o. female who presents for  Chief Complaint  Patient presents with   Follow-up    4 month cards    Doing well, no dizziness.       Past Medical History:  Diagnosis Date   Chickenpox    Dysrhythmia    atrial fib   GERD (gastroesophageal reflux disease)    Hyperlipidemia    Hypertension    Osteopenia      Past Surgical History:  Procedure Laterality Date   CHOLESTEATOMA EXCISION     COLONOSCOPY  09/27/2013   COLONOSCOPY WITH PROPOFOL  N/A 11/16/2018   Procedure: COLONOSCOPY WITH PROPOFOL ;  Surgeon: Gaylyn Gladis PENNER, MD;  Location: [facility] ENDOSCOPY;  Service: Endoscopy;  Laterality: N/A;     Current Outpatient Medications  Medication Sig Dispense Refill   atorvastatin (LIPITOR) 10 MG tablet Take 10 mg by mouth daily.     cetirizine (ZYRTEC) 10 MG tablet Take 10 mg by mouth daily.     cholecalciferol (VITAMIN D3) 10 MCG (400 UNIT) TABS tablet Take 1,000 Units by mouth daily.     dofetilide (TIKOSYN) 250 MCG capsule TAKE ONE CAPSULE BY MOUTH TWICE DAILY 180 capsule 0   Ferrous Sulfate 140 (45 Fe) MG TBCR Take 140 mg by mouth daily.     glucosamine-chondroitin 500-400 MG tablet Take 1 tablet by mouth daily.     hydrALAZINE (APRESOLINE) 100 MG tablet TAKE ONE TABLET BY MOUTH TWICE A DAY 180 tablet 0   hydrochlorothiazide (MICROZIDE) 12.5 MG capsule TAKE ONE CAPSULE BY MOUTH ONCE DAILY 90 capsule 0   HYDROcodone -acetaminophen  (NORCO/VICODIN) 5-325 MG tablet Take 1 tablet by mouth every 4 (four) hours as needed. 12 tablet 0   magnesium oxide (MAG-OX) 400 (240 Mg) MG tablet Take 400 mg by mouth daily.     metoprolol succinate (TOPROL-XL) 50 MG 24 hr tablet TAKE ONE TABLET BY MOUTH ONCE A DAY 90 tablet 0   omeprazole (PRILOSEC) 20 MG capsule Take 20 mg by  mouth daily.     XARELTO 20 MG TABS tablet TAKE ONE TABLET BY MOUTH ONCE A DAY 90 tablet 0   No current facility-administered medications for this visit.    Allergies:   Codeine    Social History:   reports that she has never smoked. She has never used smokeless tobacco. She reports that she does not drink alcohol and does not use drugs.   Family History:  family history includes Breast cancer in her paternal aunt; Breast cancer (age of onset: 86) in her sister.    ROS:     Review of Systems  Constitutional: Negative.   HENT: Negative.    Eyes: Negative.   Respiratory: Negative.    Gastrointestinal: Negative.   Genitourinary: Negative.   Musculoskeletal: Negative.   Skin: Negative.   Neurological: Negative.   Endo/Heme/Allergies: Negative.   Psychiatric/Behavioral: Negative.    All other systems reviewed and are negative.     All other systems are reviewed and negative.    PHYSICAL EXAM: VS:  BP (!) 138/54   Pulse 76   Ht 5' 2 (1.575 m)   Wt 146 lb 12.8 oz (66.6 kg)   SpO2 96%   BMI 26.85 kg/m  , BMI Body mass  index is 26.85 kg/m. Last weight:  Wt Readings from Last 3 Encounters:  06/14/24 146 lb 12.8 oz (66.6 kg)  01/13/24 140 lb 12.8 oz (63.9 kg)  12/06/23 140 lb (63.5 kg)     Physical Exam Constitutional:      Appearance: Normal appearance.  Cardiovascular:     Rate and Rhythm: Normal rate and regular rhythm.     Heart sounds: Normal heart sounds.  Pulmonary:     Effort: Pulmonary effort is normal.     Breath sounds: Normal breath sounds.  Musculoskeletal:     Right lower leg: No edema.     Left lower leg: No edema.  Neurological:     Mental Status: She is alert.       EKG:   Recent Labs: 12/06/2023: ALT 14; BUN 21; Creatinine, Ser 1.08; Hemoglobin 11.4; Platelets 259; Potassium 3.2; Sodium 136    Lipid Panel    Component Value Date/Time   CHOL 138 10/01/2012 0623   TRIG 96 10/01/2012 0623   HDL 47 10/01/2012 0623   VLDL 19 10/01/2012  0623   LDLCALC 72 10/01/2012 0623      Other studies Reviewed: Additional studies/ records that were reviewed today include:  Review of the above records demonstrates:       No data to display            ASSESSMENT AND PLAN:    ICD-10-CM   1. Paroxysmal atrial fibrillation (HCC)  I48.0 PCV ECHOCARDIOGRAM COMPLETE    2. Nonrheumatic aortic valve stenosis  I35.0 PCV ECHOCARDIOGRAM COMPLETE   mild AS/AR    3. Nonrheumatic mitral valve regurgitation  I34.0 PCV ECHOCARDIOGRAM COMPLETE   mild MR    4. Primary hypertension  I10 PCV ECHOCARDIOGRAM COMPLETE   repeat BP 120/50, low diastolic due to AR, but feels fine.       Problem List Items Addressed This Visit       Cardiovascular and Mediastinum   Atrial fibrillation (HCC) - Primary   Relevant Orders   PCV ECHOCARDIOGRAM COMPLETE   Primary hypertension   Relevant Orders   PCV ECHOCARDIOGRAM COMPLETE   Nonrheumatic aortic valve stenosis   Relevant Orders   PCV ECHOCARDIOGRAM COMPLETE   Nonrheumatic mitral valve regurgitation   Relevant Orders   PCV ECHOCARDIOGRAM COMPLETE       Disposition:   Return in about 3 months (around 09/14/2024).    Total time spent: 30 minutes  Signed,  Denyse Bathe, MD  06/14/2024 10:11 AM    [facility]

## 2024-06-28 ENCOUNTER — Ambulatory Visit: Admitting: Certified Registered Nurse Anesthetist

## 2024-06-28 ENCOUNTER — Other Ambulatory Visit: Payer: Self-pay

## 2024-06-28 ENCOUNTER — Ambulatory Visit
Admission: RE | Admit: 2024-06-28 | Discharge: 2024-06-28 | Disposition: A | Discharge status: Home / Self Care | Attending: Gastroenterology | Admitting: Gastroenterology

## 2024-06-28 ENCOUNTER — Encounter
Admission: RE | Disposition: A | Discharge status: Home / Self Care | Payer: Self-pay | Source: Home / Self Care | Attending: Gastroenterology

## 2024-06-28 DIAGNOSIS — D120 Benign neoplasm of cecum: Secondary | ICD-10-CM | POA: Insufficient documentation

## 2024-06-28 DIAGNOSIS — Z79899 Other long term (current) drug therapy: Secondary | ICD-10-CM | POA: Insufficient documentation

## 2024-06-28 DIAGNOSIS — K5730 Diverticulosis of large intestine without perforation or abscess without bleeding: Secondary | ICD-10-CM | POA: Insufficient documentation

## 2024-06-28 DIAGNOSIS — I10 Essential (primary) hypertension: Secondary | ICD-10-CM | POA: Diagnosis not present

## 2024-06-28 DIAGNOSIS — K219 Gastro-esophageal reflux disease without esophagitis: Secondary | ICD-10-CM | POA: Diagnosis not present

## 2024-06-28 DIAGNOSIS — K640 First degree hemorrhoids: Secondary | ICD-10-CM | POA: Insufficient documentation

## 2024-06-28 DIAGNOSIS — Z7901 Long term (current) use of anticoagulants: Secondary | ICD-10-CM | POA: Diagnosis not present

## 2024-06-28 DIAGNOSIS — Z1211 Encounter for screening for malignant neoplasm of colon: Secondary | ICD-10-CM | POA: Diagnosis present

## 2024-06-28 DIAGNOSIS — I4891 Unspecified atrial fibrillation: Secondary | ICD-10-CM | POA: Diagnosis not present

## 2024-06-28 DIAGNOSIS — E785 Hyperlipidemia, unspecified: Secondary | ICD-10-CM | POA: Diagnosis not present

## 2024-06-28 HISTORY — PX: COLONOSCOPY: SHX5424

## 2024-06-28 HISTORY — PX: POLYPECTOMY: SHX149

## 2024-06-28 SURGERY — COLONOSCOPY
Anesthesia: General

## 2024-06-28 MED ORDER — SODIUM CHLORIDE 0.9 % IV SOLN: INTRAVENOUS | Status: DC

## 2024-06-28 MED ORDER — PROPOFOL 10 MG/ML IV BOLUS
INTRAVENOUS | Status: AC
  Filled 2024-06-28: qty 80

## 2024-06-28 MED ORDER — PROPOFOL 10 MG/ML IV BOLUS
INTRAVENOUS | Status: DC | PRN
  Administered 2024-06-28: 20 mg via INTRAVENOUS
  Administered 2024-06-28: 70 mg via INTRAVENOUS

## 2024-06-28 MED ORDER — PROPOFOL 1000 MG/100ML IV EMUL
INTRAVENOUS | Status: AC
  Filled 2024-06-28: qty 100

## 2024-06-28 MED ORDER — PROPOFOL 500 MG/50ML IV EMUL
INTRAVENOUS | Status: DC | PRN
  Administered 2024-06-28: 100 ug/kg/min via INTRAVENOUS

## 2024-06-28 MED ORDER — LIDOCAINE HCL (CARDIAC) PF 100 MG/5ML IV SOSY
PREFILLED_SYRINGE | INTRAVENOUS | Status: DC | PRN
Start: 2024-06-28 — End: 2024-06-28
  Administered 2024-06-28: 80 mg via INTRAVENOUS

## 2024-06-28 NOTE — Anesthesia Postprocedure Evaluation (Signed)
 Anesthesia Post Note  Patient: Betty Nichols  Procedure(s) Performed: COLONOSCOPY POLYPECTOMY, INTESTINE  Patient location during evaluation: PACU Anesthesia Type: General Level of consciousness: awake and alert Pain management: pain level controlled Vital Signs Assessment: post-procedure vital signs reviewed and stable Respiratory status: spontaneous breathing, nonlabored ventilation, respiratory function stable and patient connected to nasal cannula oxygen Cardiovascular status: blood pressure returned to baseline and stable Postop Assessment: no apparent nausea or vomiting Anesthetic complications: no   No notable events documented.   Last Vitals:  Vitals:   06/28/24 0818 06/28/24 0828  BP: (!) 115/51 118/61  Pulse: 62 66  Resp: 15 18  Temp: (!) 35.9 C   SpO2: 99% 99%    Last Pain:  Vitals:   06/28/24 0818  TempSrc: Temporal                 Lynwood KANDICE Clause

## 2024-06-28 NOTE — Interval H&P Note (Signed)
 History and Physical Interval Note:  06/28/2024 7:49 AM  Betty Nichols  has presented today for surgery, with the diagnosis of Hx of adenomatous polyp of colon.  The various methods of treatment have been discussed with the patient and family. After consideration of risks, benefits and other options for treatment, the patient has consented to  Procedure(s): COLONOSCOPY (N/A) as a surgical intervention.  The patient's history has been reviewed, patient examined, no change in status, stable for surgery.  I have reviewed the patient's chart and labs.  Questions were answered to the patient's satisfaction.     [name]  Ok to proceed with colonoscopy

## 2024-06-28 NOTE — Anesthesia Preprocedure Evaluation (Signed)
 Anesthesia Evaluation  Patient identified by MRN, date of birth, ID band Patient awake    Reviewed: Allergy & Precautions, H&P , NPO status , Patient's Chart, lab work & pertinent test results, reviewed documented beta blocker date and time   Airway Mallampati: II   Neck ROM: full    Dental  (+) Poor Dentition   Pulmonary neg pulmonary ROS   Pulmonary exam normal        Cardiovascular hypertension, On Medications Normal cardiovascular exam+ dysrhythmias  Rhythm:regular Rate:Normal     Neuro/Psych negative neurological ROS  negative psych ROS   GI/Hepatic Neg liver ROS,GERD  Medicated,,  Endo/Other  negative endocrine ROS    Renal/GU negative Renal ROS  negative genitourinary   Musculoskeletal   Abdominal   Peds  Hematology negative hematology ROS (+)   Anesthesia Other Findings Past Medical History: No date: Chickenpox No date: Dysrhythmia     Comment:  atrial fib No date: GERD (gastroesophageal reflux disease) No date: Hyperlipidemia No date: Hypertension No date: Osteopenia Past Surgical History: No date: CHOLESTEATOMA EXCISION 09/27/2013: COLONOSCOPY 11/16/2018: COLONOSCOPY WITH PROPOFOL ; N/A     Comment:  Procedure: COLONOSCOPY WITH PROPOFOL ;  Surgeon:               Gaylyn Gladis PENNER, MD;  Location: [facility] ENDOSCOPY;                Service: Endoscopy;  Laterality: N/A;   Reproductive/Obstetrics negative OB ROS                              Anesthesia Physical Anesthesia Plan  ASA: 3  Anesthesia Plan: General   Post-op Pain Management:    Induction:   PONV Risk Score and Plan:   Airway Management Planned:   Additional Equipment:   Intra-op Plan:   Post-operative Plan:   Informed Consent: I have reviewed the patients History and Physical, chart, labs and discussed the procedure including the risks, benefits and alternatives for the proposed anesthesia with the  patient or authorized representative who has indicated his/her understanding and acceptance.     Dental Advisory Given  Plan Discussed with: CRNA  Anesthesia Plan Comments:         Anesthesia Quick Evaluation

## 2024-06-28 NOTE — H&P (Signed)
 Outpatient short stay form Pre-procedure 06/28/2024  [name], MD  Primary Physician: Valora Agent, MD  Reason for visit:  Surveillance  History of present illness:    78 y/o lady with history of a. Fib on DOAC (last dose 3 days ago), hypertension, and hyperlipidemia here for surveillance colonoscopy. Last colonoscopy 5 years ago was unremarkable. No family history of GI malignancies. No significant abdominal surgeries.    Current Facility-Administered Medications:    0.9 %  sodium chloride  infusion, , Intravenous, Continuous, Ronell Boldin, Ole ONEIDA, MD, Last Rate: 20 mL/hr at 06/28/24 0743, Continued from Pre-op at 06/28/24 0743  Medications Prior to Admission  Medication Sig Dispense Refill Last Dose/Taking   atorvastatin (LIPITOR) 10 MG tablet Take 10 mg by mouth daily.   06/27/2024   dofetilide (TIKOSYN) 250 MCG capsule TAKE ONE CAPSULE BY MOUTH TWICE DAILY 180 capsule 0 06/28/2024 at  6:00 AM   Ferrous Sulfate 140 (45 Fe) MG TBCR Take 140 mg by mouth daily.   Past Week   glucosamine-chondroitin 500-400 MG tablet Take 1 tablet by mouth daily.   Past Week   hydrochlorothiazide (MICROZIDE) 12.5 MG capsule TAKE ONE CAPSULE BY MOUTH ONCE DAILY 90 capsule 0 06/28/2024 at  6:00 AM   magnesium oxide (MAG-OX) 400 (240 Mg) MG tablet Take 400 mg by mouth daily.   Past Week   metoprolol succinate (TOPROL-XL) 50 MG 24 hr tablet TAKE ONE TABLET BY MOUTH ONCE A DAY 90 tablet 0 06/28/2024 at  6:00 AM   XARELTO 20 MG TABS tablet TAKE ONE TABLET BY MOUTH ONCE A DAY 90 tablet 0 06/25/2024   cetirizine (ZYRTEC) 10 MG tablet Take 10 mg by mouth daily.      cholecalciferol (VITAMIN D3) 10 MCG (400 UNIT) TABS tablet Take 1,000 Units by mouth daily.      hydrALAZINE (APRESOLINE) 100 MG tablet TAKE ONE TABLET BY MOUTH TWICE A DAY 180 tablet 0    HYDROcodone -acetaminophen  (NORCO/VICODIN) 5-325 MG tablet Take 1 tablet by mouth every 4 (four) hours as needed. 12 tablet 0    omeprazole (PRILOSEC) 20 MG capsule  Take 20 mg by mouth daily.        Allergies  Allergen Reactions   Codeine Nausea Only     Past Medical History:  Diagnosis Date   Chickenpox    Dysrhythmia    atrial fib   GERD (gastroesophageal reflux disease)    Hyperlipidemia    Hypertension    Osteopenia     Review of systems:  Otherwise negative.    Physical Exam  Gen: Alert, oriented. Appears stated age.  HEENT: PERRLA. Lungs: No respiratory distress CV: RRR Abd: soft, benign, no masses Ext: No edema    Planned procedures: Proceed with colonoscopy. The patient understands the nature of the planned procedure, indications, risks, alternatives and potential complications including but not limited to bleeding, infection, perforation, damage to internal organs and possible oversedation/side effects from anesthesia. The patient agrees and gives consent to proceed.  Please refer to procedure notes for findings, recommendations and patient disposition/instructions.     [name], MD [facility]

## 2024-06-28 NOTE — Transfer of Care (Signed)
 Immediate Anesthesia Transfer of Care Note  Patient: Betty Nichols Son  Procedure(s) Performed: COLONOSCOPY POLYPECTOMY, INTESTINE  Patient Location: PACU and Endoscopy Unit  Anesthesia Type:General  Level of Consciousness: awake, alert , and patient cooperative  Airway & Oxygen Therapy: Patient Spontanous Breathing  Post-op Assessment: Report given to RN, Post -op Vital signs reviewed and stable, and Patient moving all extremities  Post vital signs: Reviewed and stable  Last Vitals:  Vitals Value Taken Time  BP 115/51 06/28/24 08:18  Temp 35.9 C 06/28/24 08:18  Pulse 61 06/28/24 08:22  Resp 15 06/28/24 08:22  SpO2 98 % 06/28/24 08:22  Vitals shown include unfiled device data.  Last Pain:  Vitals:   06/28/24 0818  TempSrc: Temporal         Complications: No notable events documented.

## 2024-06-28 NOTE — Op Note (Signed)
 [facility] Patient Name: Betty Nichols Procedure Date: 06/28/2024 7:09 AM MRN: [id number] Account #: [id number] Date of Birth: 1945-12-28 Admit Type: Outpatient Age: 78 Room: [facility] ENDO ROOM 1 Gender: Female Note Status: Finalized Instrument Name: Colon Scope [id number] Procedure:             Colonoscopy Indications:           Surveillance: Personal history of adenomatous polyps                         on last colonoscopy 5 years ago Providers:             Ole Schick MD, MD Referring MD:          Lynwood FALCON. Valora, MD (Referring MD) Medicines:             Monitored Anesthesia Care Complications:         No immediate complications. Estimated blood loss:                         Minimal. Procedure:             Pre-Anesthesia Assessment:                        - Prior to the procedure, a History and Physical was                         performed, and patient medications and allergies were                         reviewed. The patient is competent. The risks and                         benefits of the procedure and the sedation options and                         risks were discussed with the patient. All questions                         were answered and informed consent was obtained.                         Patient identification and proposed procedure were                         verified by the physician, the nurse, the                         anesthesiologist, the anesthetist and the technician                         in the endoscopy suite. Mental Status Examination:                         alert and oriented. Airway Examination: normal                         oropharyngeal airway and neck mobility. Respiratory  Examination: clear to auscultation. CV Examination:                         normal. Prophylactic Antibiotics: The patient does not                         require prophylactic antibiotics. Prior                          Anticoagulants: The patient has taken no anticoagulant                         or antiplatelet agents. ASA Grade Assessment: III - A                         patient with severe systemic disease. After reviewing                         the risks and benefits, the patient was deemed in                         satisfactory condition to undergo the procedure. The                         anesthesia plan was to use monitored anesthesia care                         (MAC). Immediately prior to administration of                         medications, the patient was re-assessed for adequacy                         to receive sedatives. The heart rate, respiratory                         rate, oxygen saturations, blood pressure, adequacy of                         pulmonary ventilation, and response to care were                         monitored throughout the procedure. The physical                         status of the patient was re-assessed after the                         procedure.                        After obtaining informed consent, the colonoscope was                         passed under direct vision. Throughout the procedure,                         the patient's blood pressure, pulse, and oxygen  saturations were monitored continuously. The                         Colonoscope was introduced through the anus and                         advanced to the the cecum, identified by appendiceal                         orifice and ileocecal valve. The colonoscopy was                         performed without difficulty. The patient tolerated                         the procedure well. The quality of the bowel                         preparation was good. The ileocecal valve, appendiceal                         orifice, and rectum were photographed. Findings:      The perianal and digital rectal examinations were normal.      A 4 mm polyp was found in the cecum. The  polyp was sessile. The polyp       was removed with a cold snare. Resection and retrieval were complete.       Estimated blood loss was minimal.      A few small-mouthed diverticula were found in the sigmoid colon.      Internal hemorrhoids were found during retroflexion. The hemorrhoids       were Grade I (internal hemorrhoids that do not prolapse).      The exam was otherwise without abnormality on direct and retroflexion       views. Impression:            - One 4 mm polyp in the cecum, removed with a cold                         snare. Resected and retrieved.                        - Diverticulosis in the sigmoid colon.                        - Internal hemorrhoids.                        - The examination was otherwise normal on direct and                         retroflexion views. Recommendation:        - Discharge patient to home.                        - Resume previous diet.                        - Continue present medications.                        -  Await pathology results.                        - Repeat colonoscopy is not recommended due to current                         age (3 years or older) for surveillance.                        - Return to referring physician as previously                         scheduled. Procedure Code(s):     --- Professional ---                        [phone], Colonoscopy, flexible; with removal of                         tumor(s), polyp(s), or other lesion(s) by snare                         technique Diagnosis Code(s):     --- Professional ---                        Z86.010, Personal history of colonic polyps                        D12.0, Benign neoplasm of cecum                        K64.0, First degree hemorrhoids                        K57.30, Diverticulosis of large intestine without                         perforation or abscess without bleeding CPT copyright 2022 [employer]. All rights reserved. The codes  documented in this report are preliminary and upon coder review may  be revised to meet current compliance requirements. Ole Schick MD, MD 06/28/2024 8:18:17 AM Number of Addenda: 0 Note Initiated On: 06/28/2024 7:09 AM Scope Withdrawal Time: 0 hours 11 minutes 30 seconds  Total Procedure Duration: 0 hours 17 minutes 20 seconds  Estimated Blood Loss:  Estimated blood loss was minimal.      [facility]

## 2024-06-29 ENCOUNTER — Encounter: Payer: Self-pay | Admitting: Gastroenterology

## 2024-06-29 LAB — SURGICAL PATHOLOGY

## 2024-07-28 ENCOUNTER — Other Ambulatory Visit: Payer: Self-pay | Admitting: Cardiovascular Disease

## 2024-09-01 ENCOUNTER — Other Ambulatory Visit: Payer: Self-pay | Admitting: Cardiovascular Disease

## 2024-09-08 ENCOUNTER — Other Ambulatory Visit: Payer: Self-pay | Admitting: Cardiovascular Disease

## 2024-09-13 ENCOUNTER — Encounter: Payer: Self-pay | Admitting: Cardiovascular Disease

## 2024-09-13 ENCOUNTER — Ambulatory Visit: Admitting: Cardiovascular Disease

## 2024-09-13 VITALS — BP 116/75 | HR 73 | Ht 62.0 in | Wt 145.2 lb

## 2024-09-13 DIAGNOSIS — I350 Nonrheumatic aortic (valve) stenosis: Secondary | ICD-10-CM | POA: Diagnosis not present

## 2024-09-13 DIAGNOSIS — E7800 Pure hypercholesterolemia, unspecified: Secondary | ICD-10-CM

## 2024-09-13 DIAGNOSIS — I480 Paroxysmal atrial fibrillation: Secondary | ICD-10-CM

## 2024-09-13 DIAGNOSIS — I10 Essential (primary) hypertension: Secondary | ICD-10-CM | POA: Diagnosis not present

## 2024-09-13 DIAGNOSIS — I340 Nonrheumatic mitral (valve) insufficiency: Secondary | ICD-10-CM | POA: Diagnosis not present

## 2024-09-13 NOTE — Progress Notes (Signed)
 Cardiology Office Note   Date:  09/13/2024   ID:  Betty Nichols, DOB Apr 17, 1946, MRN [id number]  PCP:  Valora Lynwood FALCON, MD  Cardiologist:  Denyse Bathe, MD      History of Present Illness: Betty Nichols is a 78 y.o. female who presents for  Chief Complaint  Patient presents with   Follow-up    3 month follow up    Had some sinus issues      Past Medical History:  Diagnosis Date   Chickenpox    Dysrhythmia    atrial fib   GERD (gastroesophageal reflux disease)    Hyperlipidemia    Hypertension    Osteopenia      Past Surgical History:  Procedure Laterality Date   CHOLESTEATOMA EXCISION     COLONOSCOPY  09/27/2013   COLONOSCOPY N/A 06/28/2024   Procedure: COLONOSCOPY;  Surgeon: Maryruth Ole DASEN, MD;  Location: [facility] ENDOSCOPY;  Service: Endoscopy;  Laterality: N/A;   COLONOSCOPY WITH PROPOFOL  N/A 11/16/2018   Procedure: COLONOSCOPY WITH PROPOFOL ;  Surgeon: Gaylyn Gladis PENNER, MD;  Location[facility] ENDOSCOPY;  Service: Endoscopy;  Laterality: N/A;   POLYPECTOMY  06/28/2024   Procedure: POLYPECTOMY, INTESTINE;  Surgeon: Maryruth Ole DASEN, MD;  Location: [facility] ENDOSCOPY;  Service: Endoscopy;;     Current Outpatient Medications  Medication Sig Dispense Refill   atorvastatin (LIPITOR) 10 MG tablet Take 10 mg by mouth daily.     cetirizine (ZYRTEC) 10 MG tablet Take 10 mg by mouth daily.     cholecalciferol (VITAMIN D3) 10 MCG (400 UNIT) TABS tablet Take 1,000 Units by mouth daily.     dofetilide (TIKOSYN) 250 MCG capsule TAKE ONE CAPSULE BY MOUTH TWICE DAILY 180 capsule 0   glucosamine-chondroitin 500-400 MG tablet Take 1 tablet by mouth daily.     hydrALAZINE (APRESOLINE) 100 MG tablet TAKE ONE TABLET BY MOUTH TWICE A DAY 180 tablet 0   hydrochlorothiazide (MICROZIDE) 12.5 MG capsule TAKE ONE CAPSULE BY MOUTH ONCE DAILY 90 capsule 0   HYDROcodone -acetaminophen  (NORCO/VICODIN) 5-325 MG tablet Take 1 tablet by mouth every 4 (four) hours as needed. 12 tablet 0    magnesium oxide (MAG-OX) 400 (240 Mg) MG tablet Take 400 mg by mouth daily.     metoprolol succinate (TOPROL-XL) 50 MG 24 hr tablet TAKE ONE TABLET BY MOUTH ONCE A DAY 90 tablet 0   omeprazole (PRILOSEC) 20 MG capsule Take 20 mg by mouth daily.     XARELTO 20 MG TABS tablet TAKE ONE TABLET BY MOUTH ONCE A DAY 90 tablet 0   No current facility-administered medications for this visit.    Allergies:   Codeine    Social History:   reports that she has never smoked. She has never used smokeless tobacco. She reports that she does not drink alcohol and does not use drugs.   Family History:  family history includes Breast cancer in her paternal aunt; Breast cancer (age of onset: 75) in her sister.    ROS:     Review of Systems  Constitutional: Negative.   HENT: Negative.    Eyes: Negative.   Respiratory: Negative.    Gastrointestinal: Negative.   Genitourinary: Negative.   Musculoskeletal: Negative.   Skin: Negative.   Neurological: Negative.   Endo/Heme/Allergies: Negative.   Psychiatric/Behavioral: Negative.    All other systems reviewed and are negative.     All other systems are reviewed and negative.    PHYSICAL EXAM: VS:  BP 116/75   Pulse  73   Ht 5' 2 (1.575 m)   Wt 145 lb 3.2 oz (65.9 kg)   SpO2 98%   BMI 26.56 kg/m  , BMI Body mass index is 26.56 kg/m. Last weight:  Wt Readings from Last 3 Encounters:  09/13/24 145 lb 3.2 oz (65.9 kg)  06/28/24 143 lb 9.6 oz (65.1 kg)  06/14/24 146 lb 12.8 oz (66.6 kg)     Physical Exam Constitutional:      Appearance: Normal appearance.  Cardiovascular:     Rate and Rhythm: Normal rate and regular rhythm.     Heart sounds: Normal heart sounds.  Pulmonary:     Effort: Pulmonary effort is normal.     Breath sounds: Normal breath sounds.  Musculoskeletal:     Right lower leg: No edema.     Left lower leg: No edema.  Neurological:     Mental Status: She is alert.       EKG:   Recent Labs: 12/06/2023: ALT 14; BUN  21; Creatinine, Ser 1.08; Hemoglobin 11.4; Platelets 259; Potassium 3.2; Sodium 136    Lipid Panel    Component Value Date/Time   CHOL 138 10/01/2012 0623   TRIG 96 10/01/2012 0623   HDL 47 10/01/2012 0623   VLDL 19 10/01/2012 0623   LDLCALC 72 10/01/2012 0623      Other studies Reviewed: Additional studies/ records that were reviewed today include:  Review of the above records demonstrates:       No data to display            ASSESSMENT AND PLAN:    ICD-10-CM   1. Paroxysmal atrial fibrillation (HCC)  I48.0    In NSR, doing well    2. Nonrheumatic aortic valve stenosis  I35.0     3. Nonrheumatic mitral valve regurgitation  I34.0     4. Primary hypertension  I10     5. High cholesterol  E78.00        Problem List Items Addressed This Visit       Cardiovascular and Mediastinum   Atrial fibrillation (HCC) - Primary   Primary hypertension   Nonrheumatic aortic valve stenosis   Nonrheumatic mitral valve regurgitation     Other   High cholesterol       Disposition:   Return in about 3 months (around 12/14/2024).    Total time spent: 35 minutes  Signed,  Denyse Bathe, MD  09/13/2024 9:55 AM    [facility]

## 2024-09-15 ENCOUNTER — Other Ambulatory Visit: Payer: Self-pay | Admitting: Cardiovascular Disease

## 2024-09-15 DIAGNOSIS — I10 Essential (primary) hypertension: Secondary | ICD-10-CM

## 2024-10-07 ENCOUNTER — Other Ambulatory Visit: Payer: Self-pay | Admitting: Family Medicine

## 2024-10-07 DIAGNOSIS — Z1231 Encounter for screening mammogram for malignant neoplasm of breast: Secondary | ICD-10-CM

## 2024-10-25 ENCOUNTER — Other Ambulatory Visit: Payer: Self-pay | Admitting: Cardiovascular Disease

## 2024-11-15 ENCOUNTER — Ambulatory Visit
Admission: RE | Admit: 2024-11-15 | Discharge: 2024-11-15 | Disposition: A | Discharge status: Home / Self Care | Source: Ambulatory Visit | Attending: Family Medicine | Admitting: Family Medicine

## 2024-11-15 DIAGNOSIS — Z1231 Encounter for screening mammogram for malignant neoplasm of breast: Secondary | ICD-10-CM | POA: Diagnosis present

## 2024-11-28 ENCOUNTER — Other Ambulatory Visit: Payer: Self-pay | Admitting: Cardiology

## 2024-12-13 ENCOUNTER — Ambulatory Visit: Admitting: Cardiovascular Disease
